# Patient Record
Sex: Female | Born: 1960 | Race: White | Hispanic: No | Marital: Married | Smoking: Never smoker
Health system: Southern US, Community
[De-identification: ages and names within clinical notes are randomized; demographics above are authoritative.]

## PROBLEM LIST (undated history)

## (undated) DIAGNOSIS — M549 Dorsalgia, unspecified: Secondary | ICD-10-CM

## (undated) DIAGNOSIS — I341 Nonrheumatic mitral (valve) prolapse: Secondary | ICD-10-CM

## (undated) DIAGNOSIS — E785 Hyperlipidemia, unspecified: Secondary | ICD-10-CM

## (undated) DIAGNOSIS — Z9889 Other specified postprocedural states: Secondary | ICD-10-CM

## (undated) DIAGNOSIS — F32A Depression, unspecified: Secondary | ICD-10-CM

## (undated) DIAGNOSIS — F419 Anxiety disorder, unspecified: Secondary | ICD-10-CM

## (undated) DIAGNOSIS — E559 Vitamin D deficiency, unspecified: Secondary | ICD-10-CM

## (undated) DIAGNOSIS — S52509A Unspecified fracture of the lower end of unspecified radius, initial encounter for closed fracture: Secondary | ICD-10-CM

## (undated) DIAGNOSIS — R112 Nausea with vomiting, unspecified: Secondary | ICD-10-CM

## (undated) DIAGNOSIS — K760 Fatty (change of) liver, not elsewhere classified: Secondary | ICD-10-CM

## (undated) DIAGNOSIS — E538 Deficiency of other specified B group vitamins: Secondary | ICD-10-CM

## (undated) DIAGNOSIS — S42201A Unspecified fracture of upper end of right humerus, initial encounter for closed fracture: Secondary | ICD-10-CM

## (undated) DIAGNOSIS — E28319 Asymptomatic premature menopause: Secondary | ICD-10-CM

## (undated) DIAGNOSIS — F329 Major depressive disorder, single episode, unspecified: Secondary | ICD-10-CM

## (undated) DIAGNOSIS — M8580 Other specified disorders of bone density and structure, unspecified site: Secondary | ICD-10-CM

## (undated) HISTORY — DX: Hyperlipidemia, unspecified: E78.5

## (undated) HISTORY — DX: Dorsalgia, unspecified: M54.9

## (undated) HISTORY — DX: Unspecified fracture of the lower end of unspecified radius, initial encounter for closed fracture: S52.509A

## (undated) HISTORY — PX: OTHER SURGICAL HISTORY: SHX169

## (undated) HISTORY — DX: Vitamin D deficiency, unspecified: E55.9

## (undated) HISTORY — DX: Other specified disorders of bone density and structure, unspecified site: M85.80

## (undated) HISTORY — DX: Deficiency of other specified B group vitamins: E53.8

## (undated) HISTORY — DX: Fatty (change of) liver, not elsewhere classified: K76.0

## (undated) HISTORY — DX: Nonrheumatic mitral (valve) prolapse: I34.1

## (undated) HISTORY — DX: Asymptomatic premature menopause: E28.319

---
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
## 2001-05-21 ENCOUNTER — Other Ambulatory Visit: Admission: RE | Admit: 2001-05-21 | Discharge: 2001-05-21 | Payer: Self-pay | Admitting: Gynecology

## 2003-12-02 ENCOUNTER — Other Ambulatory Visit: Admission: RE | Admit: 2003-12-02 | Discharge: 2003-12-02 | Payer: Self-pay | Admitting: Family Medicine

## 2005-06-03 ENCOUNTER — Other Ambulatory Visit: Admission: RE | Admit: 2005-06-03 | Discharge: 2005-06-03 | Payer: Self-pay | Admitting: Obstetrics & Gynecology

## 2008-08-14 ENCOUNTER — Encounter: Payer: Self-pay | Admitting: Family Medicine

## 2008-08-14 ENCOUNTER — Other Ambulatory Visit: Admission: RE | Admit: 2008-08-14 | Discharge: 2008-08-14 | Payer: Self-pay | Admitting: Family Medicine

## 2008-08-14 LAB — CONVERTED CEMR LAB: Pap Smear: NORMAL

## 2009-02-18 ENCOUNTER — Encounter: Payer: Self-pay | Admitting: Family Medicine

## 2009-03-02 ENCOUNTER — Encounter: Payer: Self-pay | Admitting: Family Medicine

## 2009-03-04 ENCOUNTER — Encounter: Payer: Self-pay | Admitting: Family Medicine

## 2009-11-25 ENCOUNTER — Encounter: Admission: RE | Admit: 2009-11-25 | Discharge: 2009-11-25 | Payer: Self-pay | Admitting: Family Medicine

## 2009-12-08 LAB — HM MAMMOGRAPHY: HM Mammogram: NORMAL

## 2009-12-16 ENCOUNTER — Ambulatory Visit: Payer: Self-pay | Admitting: Family Medicine

## 2009-12-16 DIAGNOSIS — F329 Major depressive disorder, single episode, unspecified: Secondary | ICD-10-CM

## 2009-12-16 DIAGNOSIS — R5383 Other fatigue: Secondary | ICD-10-CM

## 2009-12-16 DIAGNOSIS — E559 Vitamin D deficiency, unspecified: Secondary | ICD-10-CM | POA: Insufficient documentation

## 2009-12-16 DIAGNOSIS — E669 Obesity, unspecified: Secondary | ICD-10-CM | POA: Insufficient documentation

## 2009-12-16 DIAGNOSIS — F3289 Other specified depressive episodes: Secondary | ICD-10-CM | POA: Insufficient documentation

## 2009-12-16 DIAGNOSIS — R5381 Other malaise: Secondary | ICD-10-CM | POA: Insufficient documentation

## 2009-12-17 LAB — CONVERTED CEMR LAB
ALT: 17 units/L (ref 0–35)
Creatinine, Ser: 0.69 mg/dL (ref 0.40–1.20)
Sodium: 142 meq/L (ref 135–145)
TSH: 2.693 microintl units/mL (ref 0.350–4.500)
Total Bilirubin: 0.4 mg/dL (ref 0.3–1.2)
Total CHOL/HDL Ratio: 4.5
Total Protein: 7.1 g/dL (ref 6.0–8.3)
Triglycerides: 67 mg/dL (ref <150)
VLDL: 13 mg/dL (ref 0–40)
Vitamin B-12: 1282 pg/mL — ABNORMAL HIGH (ref 211–911)

## 2009-12-31 ENCOUNTER — Encounter: Payer: Self-pay | Admitting: Family Medicine

## 2009-12-31 DIAGNOSIS — K449 Diaphragmatic hernia without obstruction or gangrene: Secondary | ICD-10-CM | POA: Insufficient documentation

## 2009-12-31 DIAGNOSIS — G5620 Lesion of ulnar nerve, unspecified upper limb: Secondary | ICD-10-CM | POA: Insufficient documentation

## 2010-02-04 ENCOUNTER — Ambulatory Visit: Payer: Self-pay | Admitting: Family Medicine

## 2010-02-04 DIAGNOSIS — N390 Urinary tract infection, site not specified: Secondary | ICD-10-CM | POA: Insufficient documentation

## 2010-02-04 LAB — CONVERTED CEMR LAB
Bilirubin Urine: NEGATIVE
Ketones, urine, test strip: NEGATIVE
Nitrite: NEGATIVE
Specific Gravity, Urine: 1.015
pH: 8

## 2010-02-22 ENCOUNTER — Ambulatory Visit: Payer: Self-pay | Admitting: Family Medicine

## 2010-02-22 DIAGNOSIS — E785 Hyperlipidemia, unspecified: Secondary | ICD-10-CM | POA: Insufficient documentation

## 2010-03-12 ENCOUNTER — Encounter: Payer: Self-pay | Admitting: Family Medicine

## 2010-03-15 LAB — CONVERTED CEMR LAB
AST: 18 units/L (ref 0–37)
Direct LDL: 87 mg/dL

## 2010-03-16 ENCOUNTER — Telehealth: Payer: Self-pay | Admitting: Family Medicine

## 2010-03-28 DIAGNOSIS — I341 Nonrheumatic mitral (valve) prolapse: Secondary | ICD-10-CM

## 2010-03-28 HISTORY — DX: Nonrheumatic mitral (valve) prolapse: I34.1

## 2010-04-02 ENCOUNTER — Ambulatory Visit
Admission: RE | Admit: 2010-04-02 | Discharge: 2010-04-02 | Payer: Self-pay | Source: Home / Self Care | Attending: Family Medicine | Admitting: Family Medicine

## 2010-04-23 ENCOUNTER — Ambulatory Visit
Admission: RE | Admit: 2010-04-23 | Discharge: 2010-04-23 | Payer: Self-pay | Source: Home / Self Care | Attending: Family Medicine | Admitting: Family Medicine

## 2010-04-23 ENCOUNTER — Encounter: Payer: Self-pay | Admitting: Family Medicine

## 2010-04-23 DIAGNOSIS — E538 Deficiency of other specified B group vitamins: Secondary | ICD-10-CM | POA: Insufficient documentation

## 2010-04-29 NOTE — Assessment & Plan Note (Signed)
Summary: f/u wt   Vital Signs:  Patient profile:   50 year old female Height:      59.75 inches Weight:      174 pounds BMI:     34.39 O2 Sat:      100 % on Room air Pulse rate:   83 / minute BP sitting:   102 / 57  (left arm) Cuff size:   large  Vitals Entered By: Payton Spark CMA (April 23, 2010 8:38 AM)  O2 Flow:  Room air CC: F/U weight.    Primary Care Provider:  Seymour Bars DO  CC:  F/U weight. .  History of Present Illness: 50 yo WF presents for f/u weight management.  She has plateued with her weight loss.  She was taken off Phentermine 3 wks ago b/c she failed to lose weight for 1 month.  She has stopped exercising because her daughter has moved back in and the treadmill is in her room.  She has not been exercising for the past 2 mos and has been snacking more.    She is going on a cruise in 3 wks and is worried about overeating and motion sickness.     Allergies: No Known Drug Allergies  Past History:  Past Medical History: Reviewed history from 02/22/2010 and no changes required. high cholesterol early menopause  G1P1 MVP-- schedule ECHO in 2012 premature menopause osteopenia B12 def. distal radius fx at 45-- NEEDS DEXA  Social History: Reviewed history from 12/16/2009 and no changes required. 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 Walks on the treadmill 30 min daily. Good diet. Married to [employer].  Has 1 grown child.  Review of Systems      See HPI  Physical Exam  General:  alert, well-developed, well-nourished, and well-hydrated.  obese Head:  normocephalic and atraumatic.   Mouth:  pharynx pink and moist.   Neck:  no masses.   Lungs:  Normal respiratory effort, chest expands symmetrically. Lungs are clear to auscultation, no crackles or wheezes. Heart:  Normal rate and regular rhythm. S1 and S2 normal without gallop, murmur, click, rub or other extra sounds. Skin:  color normal.   Psych:  good eye contact,  not anxious appearing, and not depressed appearing.     Impression & Recommendations:  Problem # 1:  B12 DEFICIENCY (ICD-266.2) REcheck level.  Off shot since Sept. Orders: T-Vitamin B12 ([id number])  Problem # 2:  OBESITY, UNSPECIFIED (ICD-278.00) Unchanged.  Off Phentermine x 3 wks and wt has plateued.  Pt has stopped exercising and 'lost motivation' but wants to get down to 130.  Her weight is complicated by hyperlipidemia and depression. She agrees to start dietary log, restart exercise (will move the treadmill) and will retry Phentermine 5 days/ wk. RTC in 6 wks for f/u wt/ mood.  Complete Medication List: 1)  Womens One Daily Tabs (Multiple vitamins-minerals) .... Take 1 tab by mouth once daily 2)  Calcium 500 Mg Tabs (Calcium) .... Take 1 tab by mouth once daily 3)  Aspirin 81 Mg Tbec (Aspirin) .... Take 1 tab by mouth once daily 4)  Vitamin B-6 250 Mg Tabs (Pyridoxine hcl) .... Take 1 tab by mouth once daily 5)  Wellbutrin Xl 300 Mg Xr24h-tab (Bupropion hcl) .Marland Kitchen.. 1 tab by mouth daily 6)  Phentermine Hcl 37.5 Mg Caps (Phentermine hcl) .Marland Kitchen.. 1 capsule by mouth qam 7)  Crestor 10 Mg Tabs (Rosuvastatin calcium) .Marland Kitchen.. 1 tab by mouth qhs 8)  Transderm-scop 1.5 Mg  Pt72 (Scopolamine base) .... Apply 1 patch q 3 days as needed motion sickness;  Patient Instructions: 1)  Start food and exercise diary. 2)  Increase fluid intake during the day. 3)  Try flavored water or crystal light. 4)  Aim for 45+ min of exercise 4-5 days/ wk. 5)  Restart Phenermine.   6)  B12 level today. 7)  Will call you w/ result on Monday. 8)  REturn for f/u mood/ wt in 6 wks. Prescriptions: TRANSDERM-SCOP 1.5 MG PT72 (SCOPOLAMINE BASE) apply 1 patch q 3 days as needed motion sickness;  #1 box x 0   Entered and Authorized by:   Seymour Bars DO   Signed by:   Seymour Bars DO on 04/23/2010   Method used:   Electronically to        [facility]* (retail)       [address] [place], [place]  [address]       Ph: [phone]       Fax: [phone]   RxID:   [id number] PHENTERMINE HCL 37.5 MG CAPS (PHENTERMINE HCL) 1 capsule by mouth qAM  #30 x 0   Entered and Authorized by:   Seymour Bars DO   Signed by:   Seymour Bars DO on 04/23/2010   Method used:   Printed then faxed to ...       [facility]* (retail)       [address] [place], [place]  [address]       Ph: [phone]       Fax: [phone]   RxID:   [id number]    Orders Added: 1)  T-Vitamin B12 [[id number]] 2)  Est. Patient Level III [63016]

## 2010-04-29 NOTE — Assessment & Plan Note (Signed)
Summary: nurse appt - jr  Nurse Visit   Vital Signs:  Patient profile:   50 year old female Height:      59.75 inches Weight:      175 pounds BMI:     34.59 O2 Sat:      100 % on Room air Pulse rate:   90 / minute BP sitting:   108 / 70  (left arm) Cuff size:   large  Vitals Entered By: Payton Spark CMA (April 02, 2010 8:18 AM)  O2 Flow:  Room air  Impression & Recommendations:  Problem # 1:  OBESITY, UNSPECIFIED (ICD-278.00) No wt lost on Phentermine since 11-28.  BP / HR looks great. Will not RF her phentermine.  She is to work on [employer],  ~1500 kcal/ day - high protein/ low carb with 1 hr of exercise 5-6 days/ wk and she should have f/u with me in 3 wks.  Complete Medication List: 1)  Womens One Daily Tabs (Multiple vitamins-minerals) .... Take 1 tab by mouth once daily 2)  Calcium 500 Mg Tabs (Calcium) .... Take 1 tab by mouth once daily 3)  Aspirin 81 Mg Tbec (Aspirin) .... Take 1 tab by mouth once daily 4)  Vitamin B-6 250 Mg Tabs (Pyridoxine hcl) .... Take 1 tab by mouth once daily 5)  Wellbutrin Xl 300 Mg Xr24h-tab (Bupropion hcl) .Marland Kitchen.. 1 tab by mouth daily 6)  Phentermine Hcl 37.5 Mg Caps (Phentermine hcl) .Marland Kitchen.. 1 capsule by mouth qam 7)  Crestor 10 Mg Tabs (Rosuvastatin calcium) .Marland Kitchen.. 1 tab by mouth qhs  CC: Weight and BP check   Allergies: No Known Drug Allergies  Orders Added: 1)  Est. Patient Level I [[address]109]

## 2010-04-29 NOTE — Progress Notes (Signed)
Summary: Phentermine refill  Phone Note Call from Patient   Caller: Patient Summary of Call: Pt states she has a OV scheduled for F/U weight but you told her at last OV that you would refill phentermine before OV. Pt was getting from [facility] in [place] is now out. Please advise. Initial call taken by: Payton Spark CMA,  March 16, 2010 9:57 AM    Prescriptions: PHENTERMINE HCL 37.5 MG CAPS (PHENTERMINE HCL) 1 capsule by mouth qAM  #30 x 0   Entered and Authorized by:   Seymour Bars DO   Signed by:   Seymour Bars DO on 03/16/2010   Method used:   Printed then faxed to ...       [facility]  [address] [phone][facility] (retail)       [address] [place]  [address]       Ph: [phone] or [phone]       Fax: [phone]   RxID:   [id number]   Appended Document: Phentermine refill faxed to [facility] in [facility]

## 2010-04-29 NOTE — Letter (Signed)
Summary: [facility]  [facility]   Imported By: Lanelle Bal 01/13/2010 11:40:10  _____________________________________________________________________  External Attachment:    Type:   Image     Comment:   External Document

## 2010-04-29 NOTE — Assessment & Plan Note (Signed)
Summary: UTI  Nurse Visit   Vital Signs:  Patient profile:   50 year old female Temp:     98.0 degrees F  Vitals Entered By: Payton Spark CMA (February 04, 2010 10:41 AM)  Primary Care Provider:  Seymour Bars DO   History of Present Illness: C/o hemturia a week. Dysurui, frequency.     Impression & Recommendations:  Problem # 1:  UTI (ICD-599.0)  Her updated medication list for this problem includes:    Bactrim Ds 800-160 Mg Tabs (Sulfamethoxazole-trimethoprim) .Marland Kitchen... Take 1 tablet by mouth two times a day for 3 days  Encouraged to push clear liquids, get enough rest, and take acetaminophen as needed. To be seen in 10 days if no improvement, sooner if worse.  Orders: UA Dipstick w/o Micro (automated)  (81003)  Complete Medication List: 1)  Womens One Daily Tabs (Multiple vitamins-minerals) .... Take 1 tab by mouth once daily 2)  Calcium 500 Mg Tabs (Calcium) .... Take 1 tab by mouth once daily 3)  Aspirin 81 Mg Tbec (Aspirin) .... Take 1 tab by mouth once daily 4)  Vitamin B-6 250 Mg Tabs (Pyridoxine hcl) .... Take 1 tab by mouth once daily 5)  Wellbutrin Xl 150 Mg Xr24h-tab (Bupropion hcl) .Marland Kitchen.. 1 tab by mouth qam 6)  Phentermine Hcl 37.5 Mg Caps (Phentermine hcl) .Marland Kitchen.. 1 capsule by mouth qam 7)  Crestor 10 Mg Tabs (Rosuvastatin calcium) .Marland Kitchen.. 1 tab by mouth qhs 8)  Bactrim Ds 800-160 Mg Tabs (Sulfamethoxazole-trimethoprim) .... Take 1 tablet by mouth two times a day for 3 days     Patient Instructions: 1)  Complete antiobiotic and if not better then needs Office visit.    Allergies: No Known Drug Allergies Laboratory Results   Urine Tests    Routine Urinalysis   Color: yellow Appearance: Clear Glucose: negative   (Normal Range: Negative) Bilirubin: negative   (Normal Range: Negative) Ketone: negative   (Normal Range: Negative) Spec. Gravity: 1.015   (Normal Range: 1.003-1.035) Blood: trace-intact   (Normal Range: Negative) pH: 8.0   (Normal Range:  5.0-8.0) Protein: negative   (Normal Range: Negative) Urobilinogen: 0.2   (Normal Range: 0-1) Nitrite: negative   (Normal Range: Negative) Leukocyte Esterace: small   (Normal Range: Negative)       Orders Added: 1)  UA Dipstick w/o Micro (automated)  [81003] 2)  Est. Patient Level I [60454] Prescriptions: BACTRIM DS 800-160 MG TABS (SULFAMETHOXAZOLE-TRIMETHOPRIM) Take 1 tablet by mouth two times a day for 3 days  #6 x 0   Entered and Authorized by:   Nani Gasser MD   Signed by:   Nani Gasser MD on 02/04/2010   Method used:   [facility]  [address] [phone][facility] (retail[facility]       [address] [place], [place]  [address]       Ph: [phone] or [phone]       Fax: [phone]   RxID:   [id number]

## 2010-04-29 NOTE — Letter (Signed)
Summary: [facility]  [facility]   Imported By: Lanelle Bal 01/13/2010 11:40:57  _____________________________________________________________________  External Attachment:    Type:   Image     Comment:   External Document

## 2010-04-29 NOTE — Letter (Signed)
Summary: Depression Questionnaire  Depression Questionnaire   Imported By: Lanelle Bal 12/23/2009 13:11:09  _____________________________________________________________________  External Attachment:    Type:   Image     Comment:   External Document

## 2010-04-29 NOTE — Letter (Signed)
Summary: [facility]  [facility]   Imported By: Lanelle Bal 01/13/2010 11:44:35  _____________________________________________________________________  External Attachment:    Type:   Image     Comment:   External Document

## 2010-04-29 NOTE — Letter (Signed)
Summary: Patient Health Questionnaire  Patient Health Questionnaire   Imported By: Maryln Gottron 03/05/2010 13:40:40  _____________________________________________________________________  External Attachment:    Type:   Image     Comment:   External Document

## 2010-04-29 NOTE — Miscellaneous (Signed)
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) Observations: Added new observation of PAP DUE: 08/15/2010 (12/31/2009 16:20) Added new observation of TDBOOSTDUE: 12/26/2012 (12/31/2009 16:20) Added new observation of MAMMO DUE: 12/09/2010 (12/31/2009 16:20) Added new observation of HDLNXTDUE: 12/17/2014 (12/31/2009 16:20) Added new observation of LDLNXTDUE: 12/17/2014 (12/31/2009 16:20) Added new observation of CREATNXTDUE: 12/17/2010 (12/31/2009 16:20) Added new observation of POTASSIUMDUE: 12/17/2010 (12/31/2009 16:20) Added new observation of FAMILY HX: father HTN, high cholesterol, AMI mother high chol, HTN, CVA, dementia  (12/31/2009 16:20) Added new observation of PAST MED HX: high cholesterol early menopause  G1P1 MVP premature menopause osteopenia B12 def. distal radius fx at 45-- NEEDS DEXA (12/31/2009 16:20) Added new observation of PRIMARY MD: Seymour Bars DO (12/31/2009 16:20) Added new observation of LAST MAM DAT: 12/08/2009 (12/08/2009 16:23) Added new observation of MAMMOGRAM: normal (12/08/2009 16:23) Added new observation of LAST PAP DAT: 08/14/2008 (08/14/2008 16:26) Added new observation of PAP SMEAR: normal (08/14/2008 16:26) Added new observation of TD BOOSTER: given (12/27/2002 16:25)       Past History:  Past Medical History: high cholesterol early menopause  G1P1 MVP premature menopause osteopenia B12 def. distal radius fx at 45-- NEEDS DEXA   Family History: father HTN, high cholesterol, AMI mother high chol, HTN, CVA, dementia   TD Result Date:  12/27/2002 TD Result:  given TD Next Due:  10 yr PAP Result Date:  08/14/2008 PAP Result:  normal PAP Next Due:  2 yr Mammogram Result Date:  12/08/2009 Mammogram Result:  normal Mammogram Next Due:  1 yr

## 2010-04-29 NOTE — Assessment & Plan Note (Signed)
Summary: Bethany Griffin/ wt/ cholesterol   Vital Signs:  Patient profile:   50 year old female Height:      59.75 inches Weight:      175 pounds BMI:     34.59 O2 Sat:      100 % on Room air Pulse rate:   95 / minute BP sitting:   106 / 67  (left arm) Cuff size:   large  Vitals Entered By: Payton Spark CMA (February 22, 2010 9:46 AM)  O2 Flow:  Room air CC: Bethany Griffin.   Primary Care Provider:  Seymour Bars DO  CC:  Bethany Griffin.Marland Kitchen  History of Present Illness: 50 yo WF presents for Bethany depression and obesity.  She is down another 6 lbs - doing to the [facility]ariat[facility] Clinic in [place], on Phentermine. Her B12 was too high.  TSH was normal. LDL was high 175, started Crestor 6 wks ago.  Doing well.  on Wellbutrin x 2 mos but hasn't seen much improvmenet.  Denies suicidal ideations.  Has a good support system.   Current Medications (verified): 1)  Womens One Daily  Tabs (Multiple Vitamins-Minerals) .... Take 1 Tab By Mouth Once Daily 2)  Calcium 500 Mg Tabs (Calcium) .... Take 1 Tab By Mouth Once Daily 3)  Aspirin 81 Mg Tbec (Aspirin) .... Take 1 Tab By Mouth Once Daily 4)  Vitamin B-6 250 Mg Tabs (Pyridoxine Hcl) .... Take 1 Tab By Mouth Once Daily 5)  Wellbutrin Xl 150 Mg Xr24h-Tab (Bupropion Hcl) .Marland Kitchen.. 1 Tab By Mouth Qam 6)  Phentermine Hcl 37.5 Mg Caps (Phentermine Hcl) .Marland Kitchen.. 1 Capsule By Mouth Qam 7)  Crestor 10 Mg Tabs (Rosuvastatin Calcium) .Marland Kitchen.. 1 Tab By Mouth Qhs  Allergies (verified): No Known Drug Allergies  Past History:  Past Medical History: high cholesterol early menopause  G1P1 MVP-- schedule ECHO in 2012 premature menopause osteopenia B12 def. distal radius fx at 45-- NEEDS DEXA  Past Surgical History: Reviewed history from 12/16/2009 and no changes required. eye surgery 1974, 1998, ulnar nerve surgery 2010  Social History: Reviewed history from 12/16/2009 and no changes required. 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 Walks on the  treadmill 30 min daily. Good diet. Married to [employer].  Has 1 grown child.  Review of Systems Psych:  Complains of depression and easily tearful; denies anxiety, irritability, panic attacks, and suicidal thoughts/plans.  Physical Exam  General:  alert, well-developed, well-nourished, and well-hydrated.  obese Head:  normocephalic and atraumatic.   Neck:  no masses.   Lungs:  Normal respiratory effort, chest expands symmetrically. Lungs are clear to auscultation, no crackles or wheezes. Heart:  Normal rate and regular rhythm. S1 and S2 normal without gallop, murmur, click, rub or other extra sounds. Psych:  good eye contact and flat affect.     Impression & Recommendations:  Problem # 1:  DEPRESSION, MILD (ICD-311) PHQ 9 worsened from a 10--> 11 with addition of Wellbutrin.  Will increase her to 300 mg once daily and call if any problems.  RTC in 2 mos to recheck.  Add SSRI or SNRI if not improving.  I do think that much of her depression is centered around her weight and that increasing her exercise will help with both Griffin and wt loss.   Her updated medication list for this problem includes:    Wellbutrin Xl 300 Mg Xr24h-tab (Bupropion hcl) .Marland Kitchen... 1 tab by mouth daily  Problem # 2:  OBESITY, UNSPECIFIED (ICD-278.00) BMI 34 c/w  class II obesity, down a total of 82 lbs in 1.5 yrs. She has been going to the Bariatric clinic, on phentermine for a while.  She feels like it's not working as well and she is emotionally eating and only exercising 30 min 3-4 x a wk.  We discussed diet  to be low carb, low sugar and increasing her exercise to 1 hr 5 days/ wk. I can RF her pHentermine here and see her back in 4 wks for a nurse visit to follow.  Problem # 3:  HYPERLIPIDEMIA (ICD-272.4)  Her updated medication list for this problem includes:    Crestor 10 Mg Tabs (Rosuvastatin calcium) .Marland Kitchen... 1 tab by mouth qhs Started Crestor 6 wks ago.  Doing well on it.  Lab order given to have drawn in 2-3  wks.   Orders: T-LDL Direct [phone]) T-ALT/SGPT [id number]) T-AST/SGOT [phone])  Labs Reviewed: SGOT: 17 (12/16/2009)   SGPT: 17 (12/16/2009)   HDL:53 (12/16/2009)  LDL:175 (12/16/2009)  Chol:241 (12/16/2009)  Trig:67 (12/16/2009)  Complete Medication List: 1)  Womens One Daily Tabs (Multiple vitamins-minerals) .... Take 1 tab by mouth once daily 2)  Calcium 500 Mg Tabs (Calcium) .... Take 1 tab by mouth once daily 3)  Aspirin 81 Mg Tbec (Aspirin) .... Take 1 tab by mouth once daily 4)  Vitamin B-6 250 Mg Tabs (Pyridoxine hcl) .... Take 1 tab by mouth once daily 5)  Wellbutrin Xl 300 Mg Xr24h-tab (Bupropion hcl) .Marland Kitchen.. 1 tab by mouth daily 6)  Phentermine Hcl 37.5 Mg Caps (Phentermine hcl) .Marland Kitchen.. 1 capsule by mouth qam 7)  Crestor 10 Mg Tabs (Rosuvastatin calcium) .Marland Kitchen.. 1 tab by mouth qhs  Patient Instructions: 1)  Increase Wellbutrin dose to 300 mg once daily. 2)  Update lab downstairs in 3 wks. 3)  Will call you w/ results. 4)    5)  Increase exercise to 60 min 5-6 days/ wk with intervals. 6)  Stick to a low carb/ low sugar healthy diet. 7)  REturn for follow up appt in 2 mos. Prescriptions: WELLBUTRIN XL 300 MG XR24H-TAB (BUPROPION HCL) 1 tab by mouth daily  #30 x 3   Entered and Authorized by:   Seymour Bars DO   Signed by:   Seymour Bars DO on 02/22/2010   Method used:   [facility]        [facility]  [address] [id number]       [address] [place], [place]  [address]       Ph: [phone] or [phone]       Fax: [phone]   RxID:   [id number]    Orders Added: 1)  T-LDL Direct [phone] 2)  T-ALT/SGPT [[id number]] 3)  T-AST/SGOT [[id number]] 4)  Est. Patient Level IV [99371]

## 2010-04-29 NOTE — Assessment & Plan Note (Signed)
Summary: NOV mood/ WT   Vital Signs:  Patient profile:   50 year old female Height:      59.75 inches Weight:      181 pounds BMI:     35.77 O2 Sat:      100 % on Room air Pulse rate:   87 / minute BP sitting:   117 / 76  (left arm) Cuff size:   large  Vitals Entered By: Payton Spark CMA (December 16, 2009 8:40 AM)  O2 Flow:  Room air CC: New to est. Fasting labs.   Primary Care Provider:  Seymour Bars DO  CC:  New to est. Fasting labs..  History of Present Illness: 50 yo WF presents for NOV.    She is due for fasting labs.  hx of high cholesterol, not on meds.  Has been going to the Bariatric Clinic in [place] this year and is down 77 lbs with diet, exercise and appetite suppressants.  She c/o feeling 'down'.  Has hx of depression and has not taken anything in 5-6 yrs.  Has also done counseling in the past.      Current Medications (verified): 1)  Womens One Daily  Tabs (Multiple Vitamins-Minerals) .... Take 1 Tab By Mouth Once Daily 2)  Calcium 500 Mg Tabs (Calcium) .... Take 1 Tab By Mouth Once Daily 3)  Aspirin 81 Mg Tbec (Aspirin) .... Take 1 Tab By Mouth Once Daily 4)  Vitamin B-6 250 Mg Tabs (Pyridoxine Hcl) .... 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 Walks on the treadmill 30 min daily. Good diet. Married to [employer].  Has 1 grown child.  Review of Systems       no fevers/sweats/weakness, unexplained wt loss/gain, no change in vision, no difficulty hearing, ringing in ears, no hay fever/allergies, no CP/discomfort, no palpitations, no breast lump/nipple discharge, no cough/wheeze, no blood in  stool, no N/V/D, no nocturia, no leaking urine, no unusual vag bleeding, no vaginal/penile discharge, no muscle/joint pain, no rash, no new/changing mole, no HA, no memory loss, no anxiety, no sleep problem, no depression, no unexplained lumps, no easy bruising/bleeding, no concern with sexual function   Physical Exam  General:  alert, well-developed, well-nourished, and well-hydrated.  obese Head:  normocephalic and atraumatic.   Eyes:  pupils equal, pupils round, and pupils reactive to light.   Mouth:  pharynx pink and moist.   Neck:  no masses.   Lungs:  Normal respiratory effort, chest expands symmetrically. Lungs are clear to auscultation, no crackles or wheezes. Heart:  Normal rate and regular rhythm. S1 and S2 normal without gallop, murmur, click, rub or other extra sounds. Extremities:  no E/C/C Skin:  color normal.   Cervical Nodes:  No lymphadenopathy noted Psych:  good eye contact, not anxious appearing, and flat affect.     Impression & Recommendations:  Problem # 1:  DEPRESSION, MILD (ICD-311) PHQ -9 score : 10 c/w mild depression. Discussed options with pt.  Because she has fatigue, emotional eating  problems, I think she would benefit from treatment with wellbutrin which she did well on in the past.  Call if any problems.  F/U in 2 mos.  Her updated medication list for this problem includes:    Wellbutrin Xl 150 Mg Xr24h-tab (Bupropion hcl) .Marland Kitchen... 1 tab by mouth qam  Problem # 2:  OBESITY, UNSPECIFIED (ICD-278.00) BMI 35.  Down 77 lbs.  Has been going to the [facility]. Offered her to do wt managment thru me. Continue healthy diet.  Address depression as of #1. Continue 1 hr exercise/ day. Check Vit D and B12 level with TSH today along with fasting labs.  Complete Medication List: 1)  Womens One Daily Tabs (Multiple vitamins-minerals) .... Take 1 tab by mouth once daily 2)  Calcium 500 Mg Tabs (Calcium) .... Take 1 tab by mouth once daily 3)  Aspirin 81 Mg Tbec  (Aspirin) .... Take 1 tab by mouth once daily 4)  Vitamin B-6 250 Mg Tabs (Pyridoxine hcl) .... Take 1 tab by mouth once daily 5)  Wellbutrin Xl 150 Mg Xr24h-tab (Bupropion hcl) .Marland Kitchen.. 1 tab by mouth qam 6)  Phentermine Hcl 37.5 Mg Caps (Phentermine hcl) .Marland Kitchen.. 1 capsule by mouth qam  Other Orders: T-Comprehensive Metabolic Panel [id number]) T-Lipid Profile ([id number]) T-TSH [id number]) T-Vitamin B12 [phone]) T-Vitamin D (25-Hydroxy) ([id number])  Patient Instructions: 1)  Add Wellbutrin XL 150 mg every AM for mood. 2)  You should start to see improvements after 3 wks. 3)  Keep up the excellent work with diet/ exercise/ wt loss. 4)  Check labs downstairs today. 5)  Will call you w/ results tomorrow. 6)  RTC for f/u mood/ WT in 2 mos. Prescriptions: WELLBUTRIN XL 150 MG XR24H-TAB (BUPROPION HCL) 1 tab by mouth qAM  #30 x 2   Entered and Authorized by:   Seymour Bars DO   Signed by:   Seymour Bars DO on 12/16/2009   Method used:   [facility]        [facility]  [address] [phone][facility] ([facility]ail)       [address] [place], [place]  [address]       Ph: [phone] or [phone]       Fax: [phone]   RxID:   [id number]

## 2010-06-02 ENCOUNTER — Encounter: Payer: Self-pay | Admitting: Family Medicine

## 2010-06-02 ENCOUNTER — Ambulatory Visit (INDEPENDENT_AMBULATORY_CARE_PROVIDER_SITE_OTHER): Payer: PRIVATE HEALTH INSURANCE | Admitting: Family Medicine

## 2010-06-02 DIAGNOSIS — IMO0001 Reserved for inherently not codable concepts without codable children: Secondary | ICD-10-CM

## 2010-06-02 DIAGNOSIS — F3289 Other specified depressive episodes: Secondary | ICD-10-CM

## 2010-06-02 DIAGNOSIS — E785 Hyperlipidemia, unspecified: Secondary | ICD-10-CM

## 2010-06-02 DIAGNOSIS — F329 Major depressive disorder, single episode, unspecified: Secondary | ICD-10-CM

## 2010-06-02 DIAGNOSIS — E669 Obesity, unspecified: Secondary | ICD-10-CM

## 2010-06-03 ENCOUNTER — Encounter: Payer: Self-pay | Admitting: Family Medicine

## 2010-06-03 LAB — CONVERTED CEMR LAB: Total CK: 53 units/L (ref 7–177)

## 2010-06-04 ENCOUNTER — Ambulatory Visit: Payer: Self-pay | Admitting: Family Medicine

## 2010-06-08 NOTE — Assessment & Plan Note (Signed)
Summary: f/u mood/ wt/ myalgias on Crestor   Vital Signs:  Patient profile:   50 year old female Height:      59.75 inches Weight:      179 pounds BMI:     35.38 O2 Sat:      100 % on Room air Pulse rate:   77 / minute BP sitting:   136 / 82  (left arm) Cuff size:   large  Vitals Entered By: Payton Spark CMA (June 02, 2010 10:52 AM)  O2 Flow:  Room air CC: F/U weight and mood   Primary Care Provider:  Seymour Bars DO  CC:  F/U weight and mood.  History of Present Illness: 50 yo WF presents for f/u depression, weight and joint pains since starting Crestor.  She just started having pains in all of her joints with new start of Crestor.  She stopped it 2 nights ago.  Denies recent illness, swelling or redness.  Not taking anything other than her husband's 'pain pills'.   She has gained 5 lbs even w/ phentermine and admits to not complying with her diet and not exercising.  Says she is not stressed about about the fact that her husband is having surgery for a spot in his colon that may be cancerous.   Mood is stable and she is happy with results of Wellbutrin.     Current Medications (verified): 1)  Womens One Daily  Tabs (Multiple Vitamins-Minerals) .... Take 1 Tab By Mouth Once Daily 2)  Calcium 500 Mg Tabs (Calcium) .... Take 1 Tab By Mouth Once Daily 3)  Aspirin 81 Mg Tbec (Aspirin) .... Take 1 Tab By Mouth Once Daily 4)  Vitamin B-6 250 Mg Tabs (Pyridoxine Hcl) .... Take 1 Tab By Mouth Once Daily 5)  Wellbutrin Xl 300 Mg Xr24h-Tab (Bupropion Hcl) .Marland Kitchen.. 1 Tab By Mouth Daily 6)  Crestor 10 Mg Tabs (Rosuvastatin Calcium) .Marland Kitchen.. 1 Tab By Mouth Qhs  Allergies (verified): No Known Drug Allergies  Past History:  Past Medical History: high cholesterol early menopause  G1P1 MVP-- schedule ECHO in 2012 premature menopause osteopenia B12 def.-- normal 04-2010 distal radius fx at 45-- NEEDS DEXA  Past Surgical History: Reviewed history from 12/16/2009 and no changes  required. eye surgery 1974, 1998, ulnar nerve surgery 2010  Social History: Reviewed history from 12/16/2009 and no changes required. 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 Walks on the treadmill 30 min daily. Good diet. Married to [employer].  Has 1 grown child.  Review of Systems      See HPI  Physical Exam  General:  alert, well-developed, well-nourished, and well-hydrated.   Eyes:  sclera non icteric, wears glasses Neck:  no masses and no thyromegaly.   Lungs:  Normal respiratory effort, chest expands symmetrically. Lungs are clear to auscultation, no crackles or wheezes. Heart:  Normal rate and regular rhythm. S1 and S2 normal without gallop, murmur, click, rub or other extra sounds. Msk:  no joint swelling, no joint warmth, and no redness over joints.   Extremities:  trace LE edema bilat Skin:  color normal and no rashes.   Cervical Nodes:  No lymphadenopathy noted Psych:  good eye contact, not anxious appearing, and flat affect.     Impression & Recommendations:  Problem # 1:  MYALGIA (ICD-729.1) Myalgias likely due to Crestor (new for the past 3-4 mos).  Will hold her Crestor for the next month and see if myalgias resolve.  If they do, will  try Livalo or non statin therapy.  If they do not, will restart on lower dose.  Check CK for saftey today.  Can use Ibuprofen 800 mg three times a day with food for the next 10 days for pain. Her updated medication list for this problem includes:    Aspirin 81 Mg Tbec (Aspirin) .Marland Kitchen... Take 1 tab by mouth once daily  Orders: T-CK Total ([address]109-60454)  Problem # 2:  HYPERLIPIDEMIA (ICD-272.4) Removed Crestor due to pain but had remarkable improvements in LDL on it. The following medications were removed from the medication list:    Crestor 10 Mg Tabs (Rosuvastatin calcium) .Marland Kitchen... 1 tab by mouth qhs  Labs Reviewed: SGOT: 18 (03/12/2010)   SGPT: 19 (03/12/2010)   HDL:53 (12/16/2009)  LDL:175 (12/16/2009)   Chol:241 (12/16/2009)  Trig:67 (12/16/2009)  Problem # 3:  DEPRESSION, MILD (ICD-311) PHQ 9 score: 3 indicating good control on Wellbutrin at current dose, continue. Her updated medication list for this problem includes:    Wellbutrin Xl 300 Mg Xr24h-tab (Bupropion hcl) .Marland Kitchen... 1 tab by mouth daily  Problem # 4:  OBESITY, UNSPECIFIED (ICD-278.00) BMI still 35 but she was failing to lose wt on Phentermine, in fact gained 5 lbs so explained to pt that I would not RF it.  I'd like her to work on [employer], avoiding late night snacking which is more habit and add regular exercise.  Complete Medication List: 1)  Womens One Daily Tabs (Multiple vitamins-minerals) .... Take 1 tab by mouth once daily 2)  Calcium 500 Mg Tabs (Calcium) .... Take 1 tab by mouth once daily 3)  Aspirin 81 Mg Tbec (Aspirin) .... Take 1 tab by mouth once daily 4)  Vitamin B-6 250 Mg Tabs (Pyridoxine hcl) .... Take 1 tab by mouth once daily 5)  Wellbutrin Xl 300 Mg Xr24h-tab (Bupropion hcl) .Marland Kitchen.. 1 tab by mouth daily  Patient Instructions: 1)  Stop Crestor. 2)  Check CK today.  Will call you w/ result tomorrow. 3)  Expect muscle/ joint aches to resolve in the next 2 wks. 4)  Use Ibuprofen for now as needed. 5)  Work on [employer], regular exercise, dietary logging. 6)  Return for f/u high cholesterol, weight in 2 mos. 7)  Let me know if aches / pains not resolved in 2 wks.   Orders Added: 1)  T-CK Total [82550-23250] 2)  Est. Patient Level IV [09811]

## 2010-06-15 NOTE — Letter (Signed)
Summary: Patient Health Questionnaire  Patient Health Questionnaire   Imported By: Maryln Gottron 06/08/2010 08:31:39  _____________________________________________________________________  External Attachment:    Type:   Image     Comment:   External Document

## 2010-07-13 ENCOUNTER — Other Ambulatory Visit: Payer: Self-pay | Admitting: Family Medicine

## 2010-07-21 ENCOUNTER — Telehealth: Payer: Self-pay | Admitting: Family Medicine

## 2010-07-21 ENCOUNTER — Ambulatory Visit (INDEPENDENT_AMBULATORY_CARE_PROVIDER_SITE_OTHER): Payer: PRIVATE HEALTH INSURANCE | Admitting: Family Medicine

## 2010-07-21 DIAGNOSIS — R300 Dysuria: Secondary | ICD-10-CM

## 2010-07-21 LAB — POCT URINALYSIS DIPSTICK
Blood, UA: NEGATIVE
Ketones, UA: NEGATIVE
Protein, UA: NEGATIVE
pH, UA: 8.5

## 2010-07-21 NOTE — Progress Notes (Signed)
  Subjective:    Patient ID: Bethany Griffin, female    DOB: Aug 22, 1960, 50 y.o.   MRN: [id number]  HPI Pt c/o of bladder pressure, burning, decreased urination, frequent urination, and has been taking AZO x 4 days. Some better today. Afebrile.     Review of Systems     Objective:   Physical Exam        Assessment & Plan:

## 2010-07-21 NOTE — Telephone Encounter (Signed)
Pt aware of the above  

## 2010-07-21 NOTE — Telephone Encounter (Signed)
Pt called in and complaining of bladder pressure, burning, decreased urination, frequent urination, and has been taking AZO x 4 days.  Some better today.  Afebrile.  Uses [facility]. PLAN:  Nurse appt scheduled for today 2:15pm.  Pt instructed to come with full bladder.  Last UTI 01/2010. NKDA.

## 2010-07-21 NOTE — Telephone Encounter (Signed)
Pls let pt know that her UA was normal.  She can increase water intake and take OTC AZO x 2 days.  If symptoms continue, have her set up OV

## 2010-07-21 NOTE — Progress Notes (Signed)
  Subjective:    Patient ID: Bethany Griffin, female    DOB: 11/18/1960, 50 y.o.   MRN: [id number]  HPI dysuria x 3 days   Review of Systems     Objective:   Physical Exam        Assessment & Plan:  Dysuria- UA normal.  Have pt increase her water intake and use OTC AZO x 2 days.  If symptoms continue, she will need OV.

## 2010-08-06 ENCOUNTER — Encounter: Payer: Self-pay | Admitting: Family Medicine

## 2010-08-06 ENCOUNTER — Ambulatory Visit: Payer: PRIVATE HEALTH INSURANCE | Admitting: Family Medicine

## 2010-08-06 ENCOUNTER — Ambulatory Visit (INDEPENDENT_AMBULATORY_CARE_PROVIDER_SITE_OTHER): Payer: PRIVATE HEALTH INSURANCE | Admitting: Family Medicine

## 2010-08-06 VITALS — BP 103/66 | HR 82 | Ht 63.0 in | Wt 183.0 lb

## 2010-08-06 DIAGNOSIS — M7710 Lateral epicondylitis, unspecified elbow: Secondary | ICD-10-CM

## 2010-08-06 DIAGNOSIS — E785 Hyperlipidemia, unspecified: Secondary | ICD-10-CM

## 2010-08-06 DIAGNOSIS — R635 Abnormal weight gain: Secondary | ICD-10-CM

## 2010-08-06 NOTE — Progress Notes (Signed)
  Subjective:    Patient ID: Bethany Griffin, female    DOB: 1960-09-17, 50 y.o.   MRN: [id number]  HPI 50 yo WF presents for f/u high cholesterol.  I stopped her Crestor on 3-7 due to myalgias.  I stopped her phentermine because she gained weight on it the last month and was not doing the diet and exercise part of her weight loss plan.  She is up from 179 to 183 today.  She feels like she is hungry all the time.  She has not been exercising other than yardwork.  Denies chest pain or DOE.   Her muscle aches did improve other than some residual R elbow pain that was not really bothering her before.  She is R handed.  Denies redness, heat or swelling.  Not bad enough that she's had to take anything for it.    BP 103/66  Pulse 82  Ht 5\' 3"  (1.6 m)  Wt 183 lb (83.008 kg)  BMI 32.42 kg/m2  SpO2 98%    Review of Systems  Constitutional: Positive for appetite change. Negative for fever and fatigue.  Eyes: Negative for visual disturbance.  Respiratory: Negative for shortness of breath.   Cardiovascular: Negative for chest pain, palpitations and leg swelling.  Musculoskeletal: Positive for arthralgias. Negative for myalgias, back pain and joint swelling.  Psychiatric/Behavioral: Negative for sleep disturbance and dysphoric mood. The patient is not nervous/anxious.        Objective:   Physical Exam  Constitutional: She appears well-developed and well-nourished. No distress.       obese  Eyes: No scleral icterus.  Neck: Neck supple. No thyromegaly present.  Cardiovascular: Normal rate, regular rhythm and normal heart sounds.   Pulmonary/Chest: Effort normal and breath sounds normal.  Musculoskeletal: Normal range of motion. She exhibits tenderness (tender over R lateral olecranon process over insertion of tendon laterally, full R elbow ROM).  Skin: Skin is warm and dry.  Psychiatric: She has a normal mood and affect.          Assessment & Plan:  1. R elbow pain.  Unlikely from statin  since all other joint pains improved. Likely lateral epicondylitis.  Recommend treating with a Cho Pat strap, NSAIDs, ice and relative rest and if not improved in 3 wks, she can call me for a referral to sports med for possible injection.  2.  Hyperlipidemia - her arhtralgias for the most part are much improved off simvastatin.  Will check her lipids today along with a CK level.  She will work on diet, exercise and take Omega 3 Fish oil for now and then recheck in 4-6 mos.    3.  Weight gain - pt continues to gain weight but does not seem motivated to make the essential changes to diet and exercise.

## 2010-08-06 NOTE — Patient Instructions (Signed)
Stay off Crestor. Use Omega 3 fish oil 4 grams/ day for lipids.  Work on [employer], regular exercise.  Aim for 1 hr 5 days/ wk.  Use a CHO-PAT strap for R elbow (lateral epicondylitis or tennis elbow) during the day.  Ice 15 min at the end of the workday. Call if not improving in 3 wks and I'll get you in with sports medicine.  Return in 3 mos for f/u.

## 2010-08-07 ENCOUNTER — Encounter: Payer: Self-pay | Admitting: Family Medicine

## 2010-11-10 ENCOUNTER — Encounter: Payer: Self-pay | Admitting: Family Medicine

## 2010-11-15 ENCOUNTER — Encounter: Payer: Self-pay | Admitting: Family Medicine

## 2010-11-15 ENCOUNTER — Ambulatory Visit (INDEPENDENT_AMBULATORY_CARE_PROVIDER_SITE_OTHER): Payer: PRIVATE HEALTH INSURANCE | Admitting: Family Medicine

## 2010-11-15 DIAGNOSIS — M7710 Lateral epicondylitis, unspecified elbow: Secondary | ICD-10-CM

## 2010-11-15 DIAGNOSIS — M7711 Lateral epicondylitis, right elbow: Secondary | ICD-10-CM

## 2010-11-15 DIAGNOSIS — E785 Hyperlipidemia, unspecified: Secondary | ICD-10-CM

## 2010-11-15 DIAGNOSIS — E669 Obesity, unspecified: Secondary | ICD-10-CM

## 2010-11-15 NOTE — Patient Instructions (Signed)
Stay on current meds.  Try to do a meal replacement at night for dinner - either a fruit and veggie smoothie or a slim fast shake, plenty of water and 30 + min of walking 5 days/ wk.  Return for f/u with fasting labs in 3 mos.  Update your mammogram.

## 2010-11-15 NOTE — Progress Notes (Signed)
  Subjective:    Patient ID: Bethany Griffin, female    DOB: 1960/05/25, 50 y.o.   MRN: [id number]  HPI 50 yo WF presents for f/u obesity and hyperlipidemia.  She has gained another 20 lbs in 3 mos.  She sleeps a good 8 hrs at night but feels tired all the time.  She is eating more foods that she should not be eating and not exercising.  Her R elbow Tendonitis has improved with use of the Cho Pat strap.  She is doing well on Omega 3 Fish Oil, off statins due to myalgias and will be due to have her cholesterol rechecked in another 2-3 mos.  She admits to emotional eating, binging on carbs/ sweets at night.  Taking Wellbutrin everyday.  BP 127/76  Pulse 78  Ht 4\' 11"  (1.499 m)  Wt 203 lb (92.08 kg)  BMI 41.00 kg/m2    Review of Systems  Constitutional: Positive for fatigue and unexpected weight change.  Eyes: Negative for visual disturbance.  Respiratory: Negative for shortness of breath.   Cardiovascular: Negative for chest pain, palpitations and leg swelling.       Objective:   Physical Exam  Constitutional: She appears well-developed and well-nourished.       obese  HENT:  Mouth/Throat: Oropharynx is clear and moist.  Neck: Neck supple. No thyromegaly present.  Cardiovascular: Normal rate, regular rhythm and normal heart sounds.   No murmur heard. Pulmonary/Chest: Effort normal and breath sounds normal. No respiratory distress.  Musculoskeletal: She exhibits no edema.  Psychiatric: She has a normal mood and affect.          Assessment & Plan:  R lateral epicondylitis:  Improved with use of cho pat strap.

## 2010-11-15 NOTE — Assessment & Plan Note (Signed)
BMI 41 c/w class III obesity complicated by dyslipidemia and depression. She has continued to gain more weight since her last visit.  She is motivated to try to make some changes.  Will start with meal replacement at night, plenty of water and a 30+ min walk on the treadmill after dinner.  She is due to f/u with fasting lipids/ B12 in 2-3 mos.

## 2010-11-15 NOTE — Assessment & Plan Note (Signed)
Had myalgias from statins.  Definitely needs to work on lifestyle changes and wt loss and should stay on Omega 3 Fish Oil daily.  Recheck labs in 2-3 mos.

## 2010-11-25 ENCOUNTER — Other Ambulatory Visit: Payer: Self-pay | Admitting: Family Medicine

## 2010-11-25 DIAGNOSIS — Z1231 Encounter for screening mammogram for malignant neoplasm of breast: Secondary | ICD-10-CM

## 2010-12-07 ENCOUNTER — Other Ambulatory Visit: Payer: Self-pay | Admitting: *Deleted

## 2010-12-07 MED ORDER — BUPROPION HCL ER (XL) 300 MG PO TB24: 300.0000 mg | ORAL_TABLET | ORAL | Status: DC

## 2010-12-28 ENCOUNTER — Ambulatory Visit
Admission: RE | Admit: 2010-12-28 | Discharge: 2010-12-28 | Disposition: A | Discharge status: Home / Self Care | Payer: PRIVATE HEALTH INSURANCE | Source: Ambulatory Visit | Attending: Family Medicine | Admitting: Family Medicine

## 2010-12-28 DIAGNOSIS — Z1231 Encounter for screening mammogram for malignant neoplasm of breast: Secondary | ICD-10-CM

## 2011-01-04 ENCOUNTER — Ambulatory Visit: Payer: PRIVATE HEALTH INSURANCE

## 2011-03-17 ENCOUNTER — Ambulatory Visit: Payer: PRIVATE HEALTH INSURANCE | Admitting: Family Medicine

## 2011-05-08 ENCOUNTER — Other Ambulatory Visit: Payer: Self-pay | Admitting: Family Medicine

## 2011-05-09 NOTE — Telephone Encounter (Signed)
Needs appointment

## 2011-06-01 ENCOUNTER — Other Ambulatory Visit: Payer: Self-pay | Admitting: Family Medicine

## 2011-06-02 NOTE — Telephone Encounter (Signed)
Needs appointment

## 2012-04-02 ENCOUNTER — Other Ambulatory Visit: Payer: Self-pay | Admitting: Family Medicine

## 2012-04-02 DIAGNOSIS — Z1231 Encounter for screening mammogram for malignant neoplasm of breast: Secondary | ICD-10-CM

## 2012-04-26 ENCOUNTER — Ambulatory Visit (INDEPENDENT_AMBULATORY_CARE_PROVIDER_SITE_OTHER): Payer: 59

## 2012-04-26 DIAGNOSIS — Z1231 Encounter for screening mammogram for malignant neoplasm of breast: Secondary | ICD-10-CM

## 2013-04-05 ENCOUNTER — Other Ambulatory Visit: Payer: Self-pay | Admitting: *Deleted

## 2013-04-05 ENCOUNTER — Other Ambulatory Visit: Payer: Self-pay | Admitting: Family Medicine

## 2013-04-05 DIAGNOSIS — Z1231 Encounter for screening mammogram for malignant neoplasm of breast: Secondary | ICD-10-CM

## 2013-04-05 DIAGNOSIS — Z780 Asymptomatic menopausal state: Secondary | ICD-10-CM

## 2013-04-30 ENCOUNTER — Ambulatory Visit: Payer: 59

## 2013-04-30 ENCOUNTER — Other Ambulatory Visit: Payer: 59

## 2018-02-27 ENCOUNTER — Encounter (HOSPITAL_COMMUNITY): Payer: Self-pay

## 2018-02-27 ENCOUNTER — Emergency Department (HOSPITAL_COMMUNITY)
Admission: EM | Admit: 2018-02-27 | Discharge: 2018-02-27 | Disposition: A | Discharge status: Home / Self Care | Payer: 59 | Attending: Emergency Medicine | Admitting: Emergency Medicine

## 2018-02-27 ENCOUNTER — Emergency Department (HOSPITAL_COMMUNITY): Payer: 59

## 2018-02-27 ENCOUNTER — Other Ambulatory Visit: Payer: Self-pay

## 2018-02-27 DIAGNOSIS — Y9229 Other specified public building as the place of occurrence of the external cause: Secondary | ICD-10-CM | POA: Insufficient documentation

## 2018-02-27 DIAGNOSIS — Z79899 Other long term (current) drug therapy: Secondary | ICD-10-CM | POA: Insufficient documentation

## 2018-02-27 DIAGNOSIS — Y939 Activity, unspecified: Secondary | ICD-10-CM | POA: Diagnosis not present

## 2018-02-27 DIAGNOSIS — S82142A Displaced bicondylar fracture of left tibia, initial encounter for closed fracture: Secondary | ICD-10-CM

## 2018-02-27 DIAGNOSIS — Y999 Unspecified external cause status: Secondary | ICD-10-CM | POA: Diagnosis not present

## 2018-02-27 DIAGNOSIS — S82102A Unspecified fracture of upper end of left tibia, initial encounter for closed fracture: Secondary | ICD-10-CM | POA: Insufficient documentation

## 2018-02-27 DIAGNOSIS — S8992XA Unspecified injury of left lower leg, initial encounter: Secondary | ICD-10-CM | POA: Diagnosis present

## 2018-02-27 MED ORDER — NAPROXEN 500 MG PO TABS: 500.0000 mg | ORAL_TABLET | Freq: Two times a day (BID) | ORAL | 0 refills | Status: DC

## 2018-02-27 MED ORDER — IBUPROFEN 200 MG PO TABS
600.0000 mg | ORAL_TABLET | Freq: Once | ORAL | Status: AC
  Administered 2018-02-27: 600 mg via ORAL
  Filled 2018-02-27: qty 3

## 2018-02-27 MED ORDER — OXYCODONE-ACETAMINOPHEN 5-325 MG PO TABS
1.0000 | ORAL_TABLET | Freq: Once | ORAL | Status: AC
  Administered 2018-02-27: 1 via ORAL

## 2018-02-27 MED ORDER — HYDROCODONE-ACETAMINOPHEN 5-325 MG PO TABS: 1.0000 | ORAL_TABLET | ORAL | 0 refills | Status: DC | PRN

## 2018-02-27 NOTE — Discharge Instructions (Signed)
Call Dr. Deri FuellingAluisio's office in the morning to schedule follow up. Return her sooner for any problems.
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
## 2019-01-07 ENCOUNTER — Other Ambulatory Visit: Payer: Self-pay | Admitting: Orthopedic Surgery

## 2019-01-07 DIAGNOSIS — G8929 Other chronic pain: Secondary | ICD-10-CM

## 2019-01-08 ENCOUNTER — Ambulatory Visit
Admission: RE | Admit: 2019-01-08 | Discharge: 2019-01-08 | Disposition: A | Discharge status: Home / Self Care | Payer: Self-pay | Source: Ambulatory Visit | Attending: Orthopedic Surgery | Admitting: Orthopedic Surgery

## 2019-01-08 ENCOUNTER — Other Ambulatory Visit: Payer: Self-pay

## 2019-01-08 DIAGNOSIS — G8929 Other chronic pain: Secondary | ICD-10-CM

## 2019-01-18 ENCOUNTER — Other Ambulatory Visit (HOSPITAL_COMMUNITY)
Admission: RE | Admit: 2019-01-18 | Discharge: 2019-01-18 | Disposition: A | Discharge status: Home / Self Care | Payer: No Typology Code available for payment source | Source: Ambulatory Visit | Attending: Orthopedic Surgery | Admitting: Orthopedic Surgery

## 2019-01-18 DIAGNOSIS — Z01812 Encounter for preprocedural laboratory examination: Secondary | ICD-10-CM | POA: Diagnosis present

## 2019-01-18 DIAGNOSIS — Z20828 Contact with and (suspected) exposure to other viral communicable diseases: Secondary | ICD-10-CM | POA: Insufficient documentation

## 2019-01-19 LAB — NOVEL CORONAVIRUS, NAA (HOSP ORDER, SEND-OUT TO REF LAB; TAT 18-24 HRS): SARS-CoV-2, NAA: NOT DETECTED

## 2019-01-21 ENCOUNTER — Encounter (HOSPITAL_COMMUNITY): Payer: Self-pay | Admitting: *Deleted

## 2019-01-21 ENCOUNTER — Other Ambulatory Visit: Payer: Self-pay

## 2019-01-21 NOTE — Progress Notes (Signed)
PCP - Dr. Lorenza Evangelist Cardiologist - no  Chest x-ray - no EKG - done in PCP's office will forward to Dr. Luanna Cole office Stress Test - no ECHO - no Cardiac Cath - no  Sleep Study - no CPAP -   Fasting Blood Sugar - NA Checks Blood Sugar _____ times a day  Blood Thinner Instructions:no Aspirin Instructions: Last Dose:  Anesthesia review: Pt is a Same Day she was not seen in PST.   Patient denies shortness of breath, fever, cough and chest pain at PAT appointment yes  Patient verbalized understanding of instructions that were given to them at the PAT appointment. Patient was also instructed that they will need to review over the PAT instructions again at home before surgery. Yes  Pt has a BMI of 54.53 anesthesia will have to evaluate pt DOS.

## 2019-01-22 ENCOUNTER — Ambulatory Visit (HOSPITAL_COMMUNITY): Payer: No Typology Code available for payment source

## 2019-01-22 ENCOUNTER — Encounter (HOSPITAL_COMMUNITY)
Admission: RE | Disposition: A | Discharge status: Home / Self Care | Payer: Self-pay | Source: Home / Self Care | Attending: Orthopedic Surgery

## 2019-01-22 ENCOUNTER — Ambulatory Visit (HOSPITAL_COMMUNITY): Payer: No Typology Code available for payment source | Attending: Orthopedic Surgery

## 2019-01-22 ENCOUNTER — Ambulatory Visit (HOSPITAL_COMMUNITY)
Admission: RE | Admit: 2019-01-22 | Discharge: 2019-01-22 | Disposition: A | Discharge status: Home / Self Care | Payer: No Typology Code available for payment source | Attending: Orthopedic Surgery | Admitting: Orthopedic Surgery

## 2019-01-22 ENCOUNTER — Ambulatory Visit (HOSPITAL_COMMUNITY): Payer: No Typology Code available for payment source | Admitting: Certified Registered Nurse Anesthetist

## 2019-01-22 ENCOUNTER — Encounter (HOSPITAL_COMMUNITY): Payer: Self-pay | Admitting: *Deleted

## 2019-01-22 DIAGNOSIS — S42201A Unspecified fracture of upper end of right humerus, initial encounter for closed fracture: Secondary | ICD-10-CM

## 2019-01-22 DIAGNOSIS — Z419 Encounter for procedure for purposes other than remedying health state, unspecified: Secondary | ICD-10-CM

## 2019-01-22 DIAGNOSIS — S42251A Displaced fracture of greater tuberosity of right humerus, initial encounter for closed fracture: Secondary | ICD-10-CM | POA: Diagnosis present

## 2019-01-22 DIAGNOSIS — Z79899 Other long term (current) drug therapy: Secondary | ICD-10-CM | POA: Insufficient documentation

## 2019-01-22 DIAGNOSIS — W172XXA Fall into hole, initial encounter: Secondary | ICD-10-CM | POA: Insufficient documentation

## 2019-01-22 DIAGNOSIS — S42301A Unspecified fracture of shaft of humerus, right arm, initial encounter for closed fracture: Secondary | ICD-10-CM | POA: Insufficient documentation

## 2019-01-22 DIAGNOSIS — Z6841 Body Mass Index (BMI) 40.0 and over, adult: Secondary | ICD-10-CM | POA: Diagnosis not present

## 2019-01-22 DIAGNOSIS — X58XXXA Exposure to other specified factors, initial encounter: Secondary | ICD-10-CM | POA: Insufficient documentation

## 2019-01-22 DIAGNOSIS — F419 Anxiety disorder, unspecified: Secondary | ICD-10-CM | POA: Diagnosis not present

## 2019-01-22 DIAGNOSIS — S42211A Unspecified displaced fracture of surgical neck of right humerus, initial encounter for closed fracture: Secondary | ICD-10-CM | POA: Diagnosis not present

## 2019-01-22 DIAGNOSIS — Y990 Civilian activity done for income or pay: Secondary | ICD-10-CM | POA: Diagnosis not present

## 2019-01-22 DIAGNOSIS — S42261A Displaced fracture of lesser tuberosity of right humerus, initial encounter for closed fracture: Secondary | ICD-10-CM | POA: Insufficient documentation

## 2019-01-22 DIAGNOSIS — F329 Major depressive disorder, single episode, unspecified: Secondary | ICD-10-CM | POA: Diagnosis not present

## 2019-01-22 DIAGNOSIS — E785 Hyperlipidemia, unspecified: Secondary | ICD-10-CM | POA: Diagnosis not present

## 2019-01-22 HISTORY — DX: Other specified postprocedural states: Z98.890

## 2019-01-22 HISTORY — DX: Unspecified fracture of upper end of right humerus, initial encounter for closed fracture: S42.201A

## 2019-01-22 HISTORY — DX: Depression, unspecified: F32.A

## 2019-01-22 HISTORY — DX: Nausea with vomiting, unspecified: R11.2

## 2019-01-22 HISTORY — PX: ORIF HUMERUS FRACTURE: SHX2126

## 2019-01-22 HISTORY — DX: Anxiety disorder, unspecified: F41.9

## 2019-01-22 LAB — COMPREHENSIVE METABOLIC PANEL
ALT: 19 U/L (ref 0–44)
AST: 15 U/L (ref 15–41)
Albumin: 4 g/dL (ref 3.5–5.0)
Alkaline Phosphatase: 150 U/L — ABNORMAL HIGH (ref 38–126)
Anion gap: 9 (ref 5–15)
BUN: 9 mg/dL (ref 6–20)
CO2: 24 mmol/L (ref 22–32)
Calcium: 9 mg/dL (ref 8.9–10.3)
Chloride: 105 mmol/L (ref 98–111)
Creatinine, Ser: 0.68 mg/dL (ref 0.44–1.00)
GFR calc Af Amer: >60 mL/min (ref >60)
GFR calc non Af Amer: >60 mL/min (ref >60)
Glucose, Bld: 102 mg/dL — ABNORMAL HIGH (ref 70–99)
Potassium: 4.2 mmol/L (ref 3.5–5.1)
Sodium: 138 mmol/L (ref 135–145)
Total Bilirubin: 0.5 mg/dL (ref 0.3–1.2)
Total Protein: 7.1 g/dL (ref 6.5–8.1)

## 2019-01-22 LAB — CBC
HCT: 41.2 % (ref 36.0–46.0)
Hemoglobin: 13 g/dL (ref 12.0–15.0)
MCH: 28.5 pg (ref 26.0–34.0)
MCHC: 31.6 g/dL (ref 30.0–36.0)
MCV: 90.4 fL (ref 80.0–100.0)
Platelets: 248 10*3/uL (ref 150–400)
RBC: 4.56 MIL/uL (ref 3.87–5.11)
RDW: 13.4 % (ref 11.5–15.5)
WBC: 9.9 10*3/uL (ref 4.0–10.5)
nRBC: 0 % (ref 0.0–0.2)

## 2019-01-22 LAB — HEMOGLOBIN A1C
Hgb A1c MFr Bld: 5.6 % (ref 4.8–5.6)
Mean Plasma Glucose: 114.02 mg/dL

## 2019-01-22 SURGERY — OPEN REDUCTION INTERNAL FIXATION (ORIF) PROXIMAL HUMERUS FRACTURE
Anesthesia: General | Site: Arm Upper | Laterality: Right

## 2019-01-22 MED ORDER — MIDAZOLAM HCL 2 MG/2ML IJ SOLN
INTRAMUSCULAR | Status: AC
  Filled 2019-01-22: qty 2

## 2019-01-22 MED ORDER — FENTANYL CITRATE (PF) 100 MCG/2ML IJ SOLN
INTRAMUSCULAR | Status: AC
  Filled 2019-01-22: qty 2

## 2019-01-22 MED ORDER — 0.9 % SODIUM CHLORIDE (POUR BTL) OPTIME
TOPICAL | Status: DC | PRN
  Administered 2019-01-22: 1000 mL

## 2019-01-22 MED ORDER — FENTANYL CITRATE (PF) 100 MCG/2ML IJ SOLN
INTRAMUSCULAR | Status: DC | PRN
  Administered 2019-01-22: 50 ug via INTRAVENOUS
  Administered 2019-01-22 (×2): 25 ug via INTRAVENOUS
  Administered 2019-01-22: 50 ug via INTRAVENOUS
  Administered 2019-01-22 (×2): 25 ug via INTRAVENOUS

## 2019-01-22 MED ORDER — CHLORHEXIDINE GLUCONATE 4 % EX LIQD: 60.0000 mL | Freq: Once | CUTANEOUS | Status: DC

## 2019-01-22 MED ORDER — SODIUM CHLORIDE 0.9 % IV SOLN
INTRAVENOUS | Status: DC | PRN
  Administered 2019-01-22: 14:00:00 40 ug/min via INTRAVENOUS

## 2019-01-22 MED ORDER — LACTATED RINGERS IV SOLN
INTRAVENOUS | Status: DC
  Administered 2019-01-22: 13:00:00 via INTRAVENOUS

## 2019-01-22 MED ORDER — EPHEDRINE SULFATE-NACL 50-0.9 MG/10ML-% IV SOSY
PREFILLED_SYRINGE | INTRAVENOUS | Status: DC | PRN
  Administered 2019-01-22 (×2): 10 mg via INTRAVENOUS

## 2019-01-22 MED ORDER — MIDAZOLAM HCL 2 MG/2ML IJ SOLN
1.0000 mg | Freq: Once | INTRAMUSCULAR | Status: AC
  Administered 2019-01-22: 1 mg via INTRAVENOUS
  Filled 2019-01-22: qty 2

## 2019-01-22 MED ORDER — ROCURONIUM BROMIDE 10 MG/ML (PF) SYRINGE
PREFILLED_SYRINGE | INTRAVENOUS | Status: AC
  Filled 2019-01-22: qty 10

## 2019-01-22 MED ORDER — BUPIVACAINE HCL (PF) 0.5 % IJ SOLN
INTRAMUSCULAR | Status: DC | PRN
  Administered 2019-01-22: 15 mL via PERINEURAL

## 2019-01-22 MED ORDER — CEFAZOLIN SODIUM-DEXTROSE 2-4 GM/100ML-% IV SOLN
2.0000 g | INTRAVENOUS | Status: AC
  Administered 2019-01-22: 2 g via INTRAVENOUS
  Filled 2019-01-22: qty 100

## 2019-01-22 MED ORDER — MIDAZOLAM HCL 5 MG/5ML IJ SOLN
INTRAMUSCULAR | Status: DC | PRN
  Administered 2019-01-22 (×2): 1 mg via INTRAVENOUS

## 2019-01-22 MED ORDER — STERILE WATER FOR IRRIGATION IR SOLN
Status: DC | PRN
  Administered 2019-01-22: 1000 mL

## 2019-01-22 MED ORDER — FENTANYL CITRATE (PF) 100 MCG/2ML IJ SOLN
50.0000 ug | Freq: Once | INTRAMUSCULAR | Status: AC
  Administered 2019-01-22: 50 ug via INTRAVENOUS
  Filled 2019-01-22: qty 2

## 2019-01-22 MED ORDER — ACETAMINOPHEN 500 MG PO TABS
1000.0000 mg | ORAL_TABLET | Freq: Once | ORAL | Status: AC
  Administered 2019-01-22: 1000 mg via ORAL
  Filled 2019-01-22: qty 2

## 2019-01-22 MED ORDER — ONDANSETRON HCL 4 MG/2ML IJ SOLN
INTRAMUSCULAR | Status: DC | PRN
  Administered 2019-01-22: 4 mg via INTRAVENOUS

## 2019-01-22 MED ORDER — LIDOCAINE 2% (20 MG/ML) 5 ML SYRINGE
INTRAMUSCULAR | Status: AC
  Filled 2019-01-22: qty 5

## 2019-01-22 MED ORDER — SUGAMMADEX SODIUM 500 MG/5ML IV SOLN
INTRAVENOUS | Status: DC | PRN
  Administered 2019-01-22: 250 mg via INTRAVENOUS

## 2019-01-22 MED ORDER — PROPOFOL 10 MG/ML IV BOLUS
INTRAVENOUS | Status: DC | PRN
  Administered 2019-01-22: 120 mg via INTRAVENOUS

## 2019-01-22 MED ORDER — ROCURONIUM BROMIDE 50 MG/5ML IV SOSY
PREFILLED_SYRINGE | INTRAVENOUS | Status: DC | PRN
  Administered 2019-01-22: 100 mg via INTRAVENOUS

## 2019-01-22 MED ORDER — MEPERIDINE HCL 50 MG/ML IJ SOLN: 6.2500 mg | INTRAMUSCULAR | Status: DC | PRN

## 2019-01-22 MED ORDER — LIDOCAINE 2% (20 MG/ML) 5 ML SYRINGE
INTRAMUSCULAR | Status: DC | PRN
  Administered 2019-01-22: 100 mg via INTRAVENOUS

## 2019-01-22 MED ORDER — PROPOFOL 10 MG/ML IV BOLUS
INTRAVENOUS | Status: AC
  Filled 2019-01-22: qty 20

## 2019-01-22 MED ORDER — TRANEXAMIC ACID-NACL 1000-0.7 MG/100ML-% IV SOLN
1000.0000 mg | INTRAVENOUS | Status: AC
  Administered 2019-01-22: 1000 mg via INTRAVENOUS
  Filled 2019-01-22: qty 100

## 2019-01-22 MED ORDER — ONDANSETRON HCL 4 MG/2ML IJ SOLN
4.0000 mg | Freq: Once | INTRAMUSCULAR | Status: AC | PRN
  Administered 2019-01-22: 4 mg via INTRAVENOUS

## 2019-01-22 MED ORDER — DEXAMETHASONE SODIUM PHOSPHATE 10 MG/ML IJ SOLN
INTRAMUSCULAR | Status: DC | PRN
  Administered 2019-01-22: 5 mg via INTRAVENOUS

## 2019-01-22 MED ORDER — ONDANSETRON HCL 4 MG/2ML IJ SOLN
INTRAMUSCULAR | Status: AC
  Filled 2019-01-22: qty 2

## 2019-01-22 MED ORDER — BUPIVACAINE LIPOSOME 1.3 % IJ SUSP
INTRAMUSCULAR | Status: DC | PRN
  Administered 2019-01-22: 10 mL via PERINEURAL

## 2019-01-22 MED ORDER — HYDROMORPHONE HCL 1 MG/ML IJ SOLN: 0.2500 mg | INTRAMUSCULAR | Status: DC | PRN

## 2019-01-22 SURGICAL SUPPLY — 62 items
BENZOIN TINCTURE PRP APPL 2/3 (GAUZE/BANDAGES/DRESSINGS) ×4 IMPLANT
BIT DRILL 3.2 (BIT) ×1
BIT DRILL 3.2XCALB NS DISP (BIT) IMPLANT
BIT DRILL CALIBRATED 2.7 (BIT) ×1 IMPLANT
BIT DRL 3.2XCALB NS DISP (BIT) ×1
BNDG GAUZE ELAST 4 BULKY (GAUZE/BANDAGES/DRESSINGS) ×4 IMPLANT
CLSR STERI-STRIP ANTIMIC 1/2X4 (GAUZE/BANDAGES/DRESSINGS) ×1 IMPLANT
COVER SURGICAL LIGHT HANDLE (MISCELLANEOUS) ×2 IMPLANT
COVER WAND RF STERILE (DRAPES) IMPLANT
DRAPE C-ARM 42X120 X-RAY (DRAPES) ×2 IMPLANT
DRAPE INCISE IOBAN 66X45 STRL (DRAPES) IMPLANT
DRAPE SURG 17X11 SM STRL (DRAPES) ×4 IMPLANT
DRAPE U-SHAPE 47X51 STRL (DRAPES) ×2 IMPLANT
DRSG ADAPTIC 3X8 NADH LF (GAUZE/BANDAGES/DRESSINGS) ×2 IMPLANT
DRSG MEPILEX BORDER 4X8 (GAUZE/BANDAGES/DRESSINGS) ×1 IMPLANT
DRSG PAD ABDOMINAL 8X10 ST (GAUZE/BANDAGES/DRESSINGS) ×4 IMPLANT
ELECT REM PT RETURN 15FT ADLT (MISCELLANEOUS) ×2 IMPLANT
EVACUATOR 1/8 PVC DRAIN (DRAIN) IMPLANT
GAUZE SPONGE 4X4 12PLY STRL (GAUZE/BANDAGES/DRESSINGS) ×4 IMPLANT
GLOVE BIO SURGEON STRL SZ7.5 (GLOVE) ×2 IMPLANT
GLOVE BIOGEL PI IND STRL 8 (GLOVE) ×1 IMPLANT
GLOVE BIOGEL PI INDICATOR 8 (GLOVE) ×1
GOWN STRL REUS W/TWL LRG LVL3 (GOWN DISPOSABLE) ×4 IMPLANT
K-WIRE 2X5 SS THRDED S3 (WIRE) ×4
KIT BASIN OR (CUSTOM PROCEDURE TRAY) ×2 IMPLANT
KIT TURNOVER KIT A (KITS) IMPLANT
KWIRE 2X5 SS THRDED S3 (WIRE) IMPLANT
MANIFOLD NEPTUNE II (INSTRUMENTS) ×2 IMPLANT
NS IRRIG 1000ML POUR BTL (IV SOLUTION) ×2 IMPLANT
PACK ORTHO EXTREMITY (CUSTOM PROCEDURE TRAY) ×2 IMPLANT
PEG LOCKING 3.2MMX46 (Peg) ×1 IMPLANT
PEG LOCKING 3.2MMX54 (Peg) ×1 IMPLANT
PEG LOCKING 3.2X34 (Screw) ×2 IMPLANT
PEG LOCKING 3.2X36 (Screw) ×1 IMPLANT
PEG LOCKING 3.2X40 (Peg) ×1 IMPLANT
PEG LOCKING 3.2X42 (Screw) ×1 IMPLANT
PEG LOCKING 3.2X50 (Screw) ×2 IMPLANT
PLATE 3HOLE HUMERUS PROX RT (Plate) ×1 IMPLANT
PROTECTOR NERVE ULNAR (MISCELLANEOUS) ×2 IMPLANT
SCREW LOCK CORT STAR 3.5X24 (Screw) ×1 IMPLANT
SCREW LP NL T15 3.5X24 (Screw) ×1 IMPLANT
SCREW LP NL T15 3.5X26 (Screw) ×1 IMPLANT
SLING ARM IMMOBILIZER LRG (SOFTGOODS) ×2 IMPLANT
SLING ARM IMMOBILIZER MED (SOFTGOODS) IMPLANT
SOL PREP POV-IOD 4OZ 10% (MISCELLANEOUS) ×2 IMPLANT
SOL PREP PROV IODINE SCRUB 4OZ (MISCELLANEOUS) ×2 IMPLANT
SPONGE LAP 18X18 RF (DISPOSABLE) IMPLANT
STAPLER VISISTAT 35W (STAPLE) IMPLANT
STOCKINETTE 8 INCH (MISCELLANEOUS) ×2 IMPLANT
SUCTION FRAZIER HANDLE 10FR (MISCELLANEOUS) ×1
SUCTION TUBE FRAZIER 10FR DISP (MISCELLANEOUS) ×1 IMPLANT
SUT ETHIBOND 2 0 SH (SUTURE) ×1
SUT ETHIBOND 2 0 SH 36X2 (SUTURE) ×1 IMPLANT
SUT FIBERWIRE #2 38 REV NDL BL (SUTURE) ×6
SUT MNCRL AB 4-0 PS2 18 (SUTURE) ×2 IMPLANT
SUT VIC AB 0 CT1 36 (SUTURE) ×2 IMPLANT
SUT VIC AB 3-0 SH 18 (SUTURE) ×2 IMPLANT
SUT VIC AB 4-0 PS2 27 (SUTURE) ×2 IMPLANT
SUTURE FIBERWR#2 38 REV NDL BL (SUTURE) IMPLANT
TOWEL OR 17X26 10 PK STRL BLUE (TOWEL DISPOSABLE) ×4 IMPLANT
TRAY FOLEY MTR SLVR 16FR STAT (SET/KITS/TRAYS/PACK) IMPLANT
WATER STERILE IRR 1000ML POUR (IV SOLUTION) ×2 IMPLANT

## 2019-01-22 NOTE — Anesthesia Preprocedure Evaluation (Addendum)
Anesthesia Evaluation  Patient identified by MRN, date of birth, ID band Patient awake    Reviewed: Allergy & Precautions, NPO status , Patient's Chart, lab work & pertinent test results  History of Anesthesia Complications (+) PONV and history of anesthetic complications  Airway Mallampati: III  TM Distance: >3 FB Neck ROM: Full    Dental  (+) Dental Advisory Given, Upper Dentures   Pulmonary neg pulmonary ROS,    Pulmonary exam normal        Cardiovascular negative cardio ROS Normal cardiovascular exam     Neuro/Psych PSYCHIATRIC DISORDERS Anxiety Depression negative neurological ROS     GI/Hepatic negative GI ROS, Neg liver ROS,   Endo/Other  Morbid obesity  Renal/GU negative Renal ROS     Musculoskeletal negative musculoskeletal ROS (+)   Abdominal (+) + obese,   Peds  Hematology negative hematology ROS (+)   Anesthesia Other Findings   Reproductive/Obstetrics                            Anesthesia Physical Anesthesia Plan  ASA: III  Anesthesia Plan: General   Post-op Pain Management:  Regional for Post-op pain   Induction: Intravenous  PONV Risk Score and Plan: 4 or greater and Treatment may vary due to age or medical condition, Ondansetron, Dexamethasone and Midazolam  Airway Management Planned: Oral ETT  Additional Equipment: None  Intra-op Plan:   Post-operative Plan: Extubation in OR  Informed Consent: I have reviewed the patients History and Physical, chart, labs and discussed the procedure including the risks, benefits and alternatives for the proposed anesthesia with the patient or authorized representative who has indicated his/her understanding and acceptance.     Dental advisory given  Plan Discussed with: CRNA and Anesthesiologist  Anesthesia Plan Comments:        Anesthesia Quick Evaluation

## 2019-01-22 NOTE — Anesthesia Postprocedure Evaluation (Signed)
Anesthesia Post Note  Patient: Bethany Griffin  Procedure(s) Performed: OPEN REDUCTION INTERNAL FIXATION (ORIF) PROXIMAL HUMERUS FRACTURE (Right Arm Upper)     Patient location during evaluation: PACU Anesthesia Type: General Level of consciousness: awake and alert Pain management: pain level controlled Vital Signs Assessment: post-procedure vital signs reviewed and stable Respiratory status: spontaneous breathing, nonlabored ventilation, respiratory function stable and patient connected to nasal cannula oxygen Cardiovascular status: blood pressure returned to baseline and stable Postop Assessment: no apparent nausea or vomiting Anesthetic complications: no    Last Vitals:  Vitals:   01/22/19 1645 01/22/19 1720  BP: 129/70 (!) 148/78  Pulse: 100 (!) 103  Resp: 19 18  Temp: 36.5 C (!) 36.3 C  SpO2: 92% 93%    Last Pain:  Vitals:   01/22/19 1720  TempSrc:   PainSc: 0-No pain                 Kammy Klett DAVID

## 2019-01-22 NOTE — Discharge Instructions (Signed)
Diet: As you were doing prior to hospitalization   Shower:  May shower but keep the wounds dry, use an occlusive plastic wrap, NO SOAKING IN TUB.  If the bandage gets wet, change with a clean dry gauze.  If you have a splint on, leave the splint in place and keep the splint dry with a plastic bag.  Dressing:  You may change your dressing 3-5 days after surgery, unless you have a splint.  If you have a splint, then just leave the splint in place and we will change your bandages during your first follow-up appointment.    If you had hand or foot surgery, we will plan to remove your stitches in about 2 weeks in the office.  For all other surgeries, there are sticky tapes (steri-strips) on your wounds and all the stitches are absorbable.  Leave the steri-strips in place when changing your dressings, they will peel off with time, usually 2-3 weeks.  Activity:  Increase activity slowly as tolerated, but follow the weight bearing instructions below.  The rules on driving is that you can not be taking narcotics while you drive, and you must feel in control of the vehicle.    Weight Bearing:   Sling at all times except for hygiene.    To prevent constipation: you may use a stool softener such as -  Colace (over the counter) 100 mg by mouth twice a day  Drink plenty of fluids (prune juice may be helpful) and high fiber foods Miralax (over the counter) for constipation as needed.    Itching:  If you experience itching with your medications, try taking only a single pain pill, or even half a pain pill at a time.  You may take up to 10 pain pills per day, and you can also use benadryl over the counter for itching or also to help with sleep.   Precautions:  If you experience chest pain or shortness of breath - call 911 immediately for transfer to the hospital emergency department!!  If you develop a fever greater that 101 F, purulent drainage from wound, increased redness or drainage from wound, or calf pain  -- Call the office at [phone]                                                Follow- Up Appointment:  Please call for an appointment to be seen in 2 weeks [place] - [phone]      General Anesthesia, Adult, Care After This sheet gives you information about how to care for yourself after your procedure. Your health care provider may also give you more specific instructions. If you have problems or questions, contact your health care provider. What can I expect after the procedure? After the procedure, the following side effects are common:  Pain or discomfort at the IV site.  Nausea.  Vomiting.  Sore throat.  Trouble concentrating.  Feeling cold or chills.  Weak or tired.  Sleepiness and fatigue.  Soreness and body aches. These side effects can affect parts of the body that were not involved in surgery. Follow these instructions at home:  For at least 24 hours after the procedure:  Have a responsible adult stay with you. It is important to have someone help care for you until you are awake and alert.  Rest as needed.  Do not: ?  Participate in activities in which you could fall or become injured. ? Drive. ? Use heavy machinery. ? Drink alcohol. ? Take sleeping pills or medicines that cause drowsiness. ? Make important decisions or sign legal documents. ? Take care of children on your own. Eating and drinking  Follow any instructions from your health care provider about eating or drinking restrictions.  When you feel hungry, start by eating small amounts of foods that are soft and easy to digest (bland), such as toast. Gradually return to your regular diet.  Drink enough fluid to keep your urine pale yellow.  If you vomit, rehydrate by drinking water, juice, or clear broth. General instructions  If you have sleep apnea, surgery and certain medicines can increase your risk for breathing problems. Follow instructions from your health care provider about  wearing your sleep device: ? Anytime you are sleeping, including during daytime naps. ? While taking prescription pain medicines, sleeping medicines, or medicines that make you drowsy.  Return to your normal activities as told by your health care provider. Ask your health care provider what activities are safe for you.  Take over-the-counter and prescription medicines only as told by your health care provider.  If you smoke, do not smoke without supervision.  Keep all follow-up visits as told by your health care provider. This is important. Contact a health care provider if:  You have nausea or vomiting that does not get better with medicine.  You cannot eat or drink without vomiting.  You have pain that does not get better with medicine.  You are unable to pass urine.  You develop a skin rash.  You have a fever.  You have redness around your IV site that gets worse. Get help right away if:  You have difficulty breathing.  You have chest pain.  You have blood in your urine or stool, or you vomit blood. Summary  After the procedure, it is common to have a sore throat or nausea. It is also common to feel tired.  Have a responsible adult stay with you for the first 24 hours after general anesthesia. It is important to have someone help care for you until you are awake and alert.  When you feel hungry, start by eating small amounts of foods that are soft and easy to digest (bland), such as toast. Gradually return to your regular diet.  Drink enough fluid to keep your urine pale yellow.  Return to your normal activities as told by your health care provider. Ask your health care provider what activities are safe for you. This information is not intended to replace advice given to you by your health care provider. Make sure you discuss any questions you have with your health care provider. Document Released: 06/20/2000 Document Revised: 03/17/2017 Document Reviewed:  10/28/2016 Elsevier Patient Education  2020 [employer].

## 2019-01-22 NOTE — H&P (Signed)
PREOPERATIVE H&P  Chief Complaint: Right shoulder pain  HPI: Bethany Griffin is a 58 y.o. female who presents for preoperative history and physical with a diagnosis of right proximal humerus fracture. Symptoms are rated as moderate to severe, and have been worsening.  This is significantly impairing activities of daily living.  She has elected for surgical management.   She had a fall on October 7, when she was on the job and stepped in a hole in the floor, lost her balance and fell.  X-rays demonstrated significant displacement of the proximal humerus fracture particular the greater tuberosity.  The head had some impaction as well.  Past Medical History:  Diagnosis Date  . Anxiety   . B12 deficiency   . Depression   . Distal radial fracture   . Early menopause   . Hyperlipidemia   . MVP (mitral valve prolapse) 2012   Echo   . Osteopenia   . PONV (postoperative nausea and vomiting)    Past Surgical History:  Procedure Laterality Date  . [place], ulnar nerve surgery 2010  . ulnar nerve surgery     Social History   Socioeconomic History  . Marital status: Married    Spouse name: Not on file  . Number of children: Not on file  . Years of education: Not on file  . Highest education level: Not on file  Occupational History  . Not on file  Social Needs  . Financial resource strain: Not on file  . Food insecurity    Worry: Not on file    Inability: Not on file  . Transportation needs    Medical: Not on file    Non-medical: Not on file  Tobacco Use  . Smoking status: Never Smoker  . Smokeless tobacco: Never Used  Substance and Sexual Activity  . Alcohol use: No  . Drug use: No  . Sexual activity: Not on file  Lifestyle  . Physical activity    Days per week: Not on file    Minutes per session: Not on file  . Stress: Not on file  Relationships  . Social Herbalist on phone: Not on file    Gets together: Not on file    Attends religious  service: Not on file    Active member of club or organization: Not on file    Attends meetings of clubs or organizations: Not on file    Relationship status: Not on file  Other Topics Concern  . Not on file  Social History Narrative  . Not on file   Family History  Problem Relation Age of Onset  . Hyperlipidemia Mother   . Hypertension Mother   . Dementia Mother   . Stroke Mother   . Hypertension Father   . Hyperlipidemia Father   . Heart disease Father        AMI   No Known Allergies Prior to Admission medications   Medication Sig Start Date End Date Taking? Authorizing Provider  buPROPion (WELLBUTRIN XL) 300 MG 24 hr tablet TAKE 1 TABLET (300 MG TOTAL) BY MOUTH EVERY MORNING. Patient taking differently: Take 300 mg by mouth every evening.  06/01/11  Yes Hali Marry, MD  clonazePAM (KLONOPIN) 0.5 MG tablet Take 0.5 mg by mouth 2 (two) times daily as needed for anxiety. 08/24/18  Yes [provider]  pravastatin (PRAVACHOL) 40 MG tablet Take 40 mg by mouth every evening. 01/08/19  Yes [provider]  calcium gluconate 500 MG tablet Take 500 mg by mouth daily.      [provider]     Positive ROS: All other systems have been reviewed and were otherwise negative with the exception of those mentioned in the HPI and as above.  Physical Exam: General: Alert, no acute distress Cardiovascular: No pedal edema Respiratory: No cyanosis, no use of accessory musculature GI: No organomegaly, abdomen is soft and non-tender Skin: No lesions in the area of chief complaint Neurologic: Sensation intact distally Psychiatric: Patient is competent for consent with normal mood and affect Lymphatic: No axillary or cervical lymphadenopathy  MUSCULOSKELETAL: Right shoulder has significant bruising, sensation intact distally, active motion was not tested.  Assessment: Right proximal humerus fracture with coexisting morbid obesity.   Plan: Plan for  Procedure(s): OPEN REDUCTION INTERNAL FIXATION (ORIF) PROXIMAL HUMERUS FRACTURE  The risks benefits and alternatives were discussed with the patient including but not limited to the risks of nonoperative treatment, versus surgical intervention including infection, bleeding, nerve injury, malunion, nonunion, the need for revision surgery, hardware prominence, hardware failure, the need for hardware removal, blood clots, cardiopulmonary complications, morbidity, mortality, among others, and they were willing to proceed.      Eulas Post, MD Cell [phone]   01/22/2019 12:42 PM

## 2019-01-22 NOTE — Progress Notes (Signed)
Assisted Dr. Brock with right, ultrasound guided, interscalene  block. Side rails up, monitors on throughout procedure. See vital signs in flow sheet. Tolerated Procedure well.  

## 2019-01-22 NOTE — Anesthesia Procedure Notes (Signed)
Anesthesia Regional Block: Interscalene brachial plexus block   Pre-Anesthetic Checklist: ,, timeout performed, Correct Patient, Correct Site, Correct Laterality, Correct Procedure, Correct Position, site marked, Risks and benefits discussed,  Surgical consent,  Pre-op evaluation,  At surgeon's request and post-op pain management  Laterality: Right  Prep: chloraprep       Needles:  Injection technique: Single-shot  Needle Type: Echogenic Needle     Needle Length: 5cm  Needle Gauge: 21     Additional Needles:   Narrative:  Start time: 01/22/2019 12:55 PM End time: 01/22/2019 12:59 PM Injection made incrementally with aspirations every 5 mL.  Performed by: Personally  Anesthesiologist: Audry Pili, MD  Additional Notes: No pain on injection. No increased resistance to injection. Injection made in 5cc increments. Good needle visualization. Patient tolerated the procedure well.

## 2019-01-22 NOTE — Addendum Note (Signed)
Addendum  created 01/22/19 2014 by West Pugh, CRNA   Flowsheet accepted, Intraprocedure Flowsheets edited

## 2019-01-22 NOTE — Transfer of Care (Signed)
Immediate Anesthesia Transfer of Care Note  Patient: Bethany Griffin  Procedure(s) Performed: OPEN REDUCTION INTERNAL FIXATION (ORIF) PROXIMAL HUMERUS FRACTURE (Right Arm Upper)  Patient Location: PACU  Anesthesia Type:GA combined with regional for post-op pain  Level of Consciousness: awake, alert  and patient cooperative  Airway & Oxygen Therapy: Patient Spontanous Breathing and Patient connected to face mask oxygen  Post-op Assessment: Report given to RN and Post -op Vital signs reviewed and stable  Post vital signs: Reviewed and stable  Last Vitals:  Vitals Value Taken Time  BP 156/79 01/22/19 1606  Temp 36.5 C 01/22/19 1606  Pulse 97 01/22/19 1609  Resp 15 01/22/19 1609  SpO2 97 % 01/22/19 1609  Vitals shown include unvalidated device data.  Last Pain:  Vitals:   01/22/19 1606  TempSrc:   PainSc: (P) Asleep         Complications: No apparent anesthesia complications

## 2019-01-22 NOTE — Anesthesia Procedure Notes (Addendum)
Procedure Name: Intubation Date/Time: 01/22/2019 1:35 PM Performed by: West Pugh, CRNA Pre-anesthesia Checklist: Patient identified, Emergency Drugs available, Suction available, Patient being monitored and Timeout performed Patient Re-evaluated:Patient Re-evaluated prior to induction Oxygen Delivery Method: Circle system utilized Preoxygenation: Pre-oxygenation with 100% oxygen Induction Type: IV induction Ventilation: Mask ventilation without difficulty Laryngoscope Size: Mac and 4 Grade View: Grade II Tube type: Oral Tube size: 7.0 mm Number of attempts: 1 Airway Equipment and Method: Stylet Placement Confirmation: ETT inserted through vocal cords under direct vision,  positive ETCO2,  CO2 detector and breath sounds checked- equal and bilateral Secured at: 21 cm Tube secured with: Tape Dental Injury: Teeth and Oropharynx as per pre-operative assessment

## 2019-01-22 NOTE — Op Note (Signed)
01/22/2019  3:41 PM  PATIENT:  Bethany Griffin    PRE-OPERATIVE DIAGNOSIS:  RIGHT PROXIMAL HUMERUS FRACTURE  POST-OPERATIVE DIAGNOSIS:  Same  PROCEDURE:  OPEN REDUCTION INTERNAL FIXATION (ORIF) PROXIMAL HUMERUS FRACTURE  SURGEON:  Eulas Post, MD  PHYSICIAN ASSISTANT: Janace Litten, OPA-C, present and scrubbed throughout the case, critical for completion in a timely fashion, and for retraction, instrumentation, and closure.  ANESTHESIA:   General with Exparel interscalene block  ESTIMATED BLOOD LOSS: 150 mL  PREOPERATIVE INDICATIONS:  Bethany Griffin is a  58 y.o. female with a diagnosis of RIGHT PROXIMAL HUMERUS FRACTURE who elected for surgical management.  She had significant impaction of the humeral head, with displacement of the tuberosity.  The risks benefits and alternatives were discussed with the patient including but not limited to the risks of nonoperative treatment, versus surgical intervention including infection, bleeding, nerve injury, malunion, nonunion, the need for revision surgery, hardware prominence, hardware failure, the need for hardware removal, blood clots, cardiopulmonary complications, conversion to arthroplasty, morbidity, mortality, among others, and they were willing to proceed.  Predicted outcome is good, although there will be at least a six to nine month expected recovery.   OPERATIVE IMPLANTS: Biomet ALPS proximal humerus locking plate.  With a total of two #2 FiberWire through the greater tuberosity, and one through the lesser tuberosity  OPERATIVE FINDINGS: There was significant impaction of the humeral head, and the articular surface is actually exposed and pointing vertically.  The lesser tuberosity was slightly displaced, but the greater tuberosity was significantly displaced posteriorly, and in fact displaced somewhat distally.  Bone quality was mediocre.  UNIQUE ASPECTS OF THE CASE: Mobilizing the tuberosity was fairly difficult.  Even after  mobilization, I wanted to remain somewhat posterior and somewhat distal, but I was able to achieve satisfactory fixation bringing it back to the shaft.  I was 50-50 on whether to convert to a reverse, but ultimately I was able to achieve appropriate head fixation, had a good subscapularis, and had fixation in the greater tuberosity.  There was a FiberWire that pulled through the tuberosity during manipulation, and created a small fracture line that was distal to the actual substance of the rotator cuff attachment.  Her BMI is 50, and access was slightly challenging.  OPERATIVE PROCEDURE: The patient was brought to the operating room and placed in the supine position. General anesthesia was administered. IV antibiotics were given. She was placed in the beach chair position. All bony prominences were padded. The upper extremity was prepped and draped in usual sterile fashion. Deltopectoral incision was performed.  I exposed the fracture site, and placed deep retractors. I did not tenotomize the biceps tendon. This was left in place. I elevated a small portion of the deltoid off of the shaft, in order to gain access for the plate. I placed supraspinatus and subscapularis stitches, and then reduced the head onto the shaft. This was maintained in satisfactory position.  I had to mobilize the head significantly, propping it up and elevating it probably almost 2 cm.  There was a significant change in the angulation of the head, getting out of the severe valgus impaction.  Mobilization of the greater tuberosity was also somewhat challenging, there was a lot of adhesions and soft tissue that had already been attached to it, I used blunt dissection, and was conscientious of the axillary nerve although did not really encounter it laterally.  I applied the plate and secured it into the sliding hole first. I  confirmed position of the reduction and the plate with C-arm, and I placed a total of 2 guidewires into the  appropriate position in the head. I was satisfied that the plate was distal appropriately, and then secured the plate proximally with smooth pegs, taking care to prevent penetration into the arch articular surface, using C-arm, as well as manual feel using a hand drill.  I then secured the plate distally using another cortical screw. I then passed the FiberWire sutures from the subscapularis and supraspinatus through the plate and secured the tuberosities. Once complete fixation and reduction of been achieved, took final C-arm pictures, and irrigated the wounds copiously, and repaired the deltopectoral interval with Vicryl followed by Vicryl for the subcutaneous tissue with Monocryl and Steri-Strips for the skin. She was placed in a sling. She had a preoperative regional block as well. She tolerated the procedure well with no complications.

## 2019-01-24 ENCOUNTER — Encounter (HOSPITAL_COMMUNITY): Payer: Self-pay | Admitting: Orthopedic Surgery

## 2019-01-25 ENCOUNTER — Encounter (HOSPITAL_COMMUNITY): Admission: RE | Admit: 2019-01-25 | Payer: Self-pay | Source: Ambulatory Visit

## 2021-06-28 DIAGNOSIS — K760 Fatty (change of) liver, not elsewhere classified: Secondary | ICD-10-CM | POA: Diagnosis not present

## 2021-06-28 DIAGNOSIS — K8020 Calculus of gallbladder without cholecystitis without obstruction: Secondary | ICD-10-CM | POA: Diagnosis not present

## 2021-06-28 DIAGNOSIS — R7401 Elevation of levels of liver transaminase levels: Secondary | ICD-10-CM | POA: Diagnosis not present

## 2021-08-04 DIAGNOSIS — H02882 Meibomian gland dysfunction right lower eyelid: Secondary | ICD-10-CM | POA: Diagnosis not present

## 2021-08-22 DIAGNOSIS — H524 Presbyopia: Secondary | ICD-10-CM | POA: Diagnosis not present

## 2021-08-30 DIAGNOSIS — R799 Abnormal finding of blood chemistry, unspecified: Secondary | ICD-10-CM | POA: Diagnosis not present

## 2021-08-30 DIAGNOSIS — E782 Mixed hyperlipidemia: Secondary | ICD-10-CM | POA: Diagnosis not present

## 2021-10-06 IMAGING — CT CT SHOULDER*R* W/O CM
1 series · 12 of 14 positions shown, 15 images · non-contrast
Comparison: No prior comparison imaging is available.

CLINICAL DATA: Right shoulder pain after fall 01/02/2019. Reported
right humerus fracture.

EXAM:
CT OF THE UPPER RIGHT EXTREMITY WITHOUT CONTRAST
TECHNIQUE: Multidetector CT imaging of the upper right extremity was performed
according to the standard protocol.

[Series 4: soft tissue · axial · 0.49mm/px · z∈[-215,-49]mm · 12 of 99 slices shown, 15 images]
[im 8/99  soft-tissue]
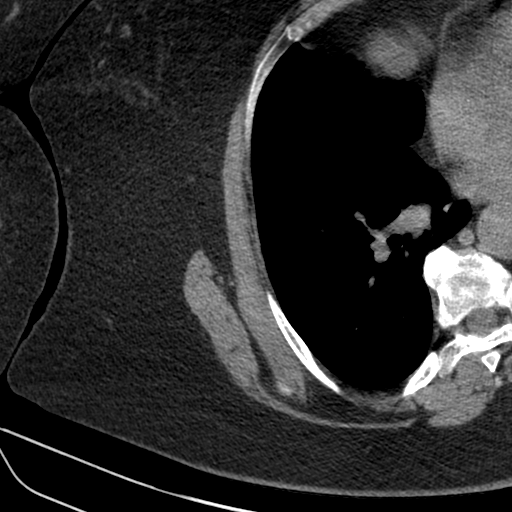
[im 8/99  bone]
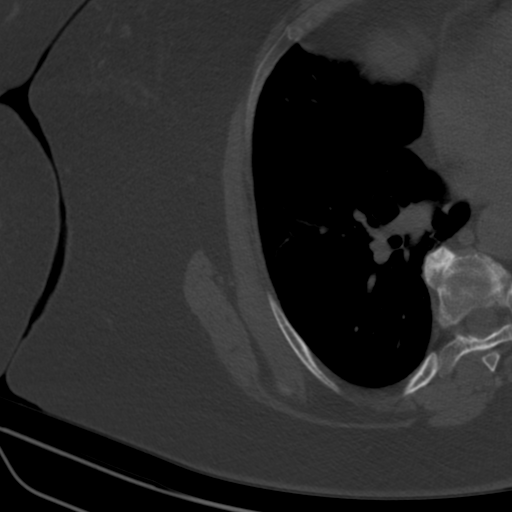
[im 16/99  bone]
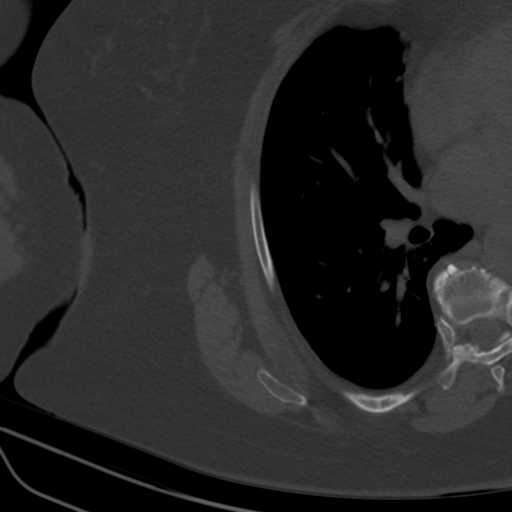
[im 23/99  bone]
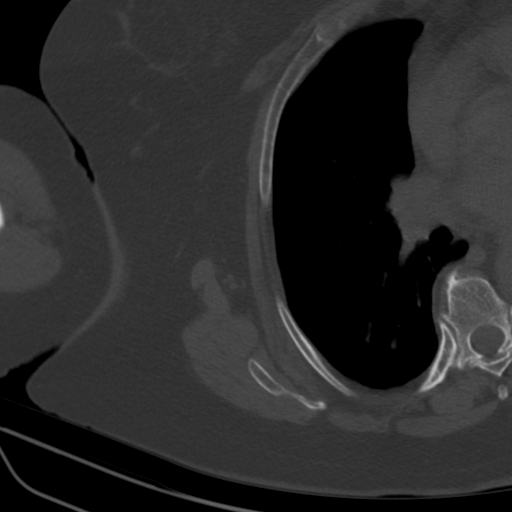
[im 31/99  bone]
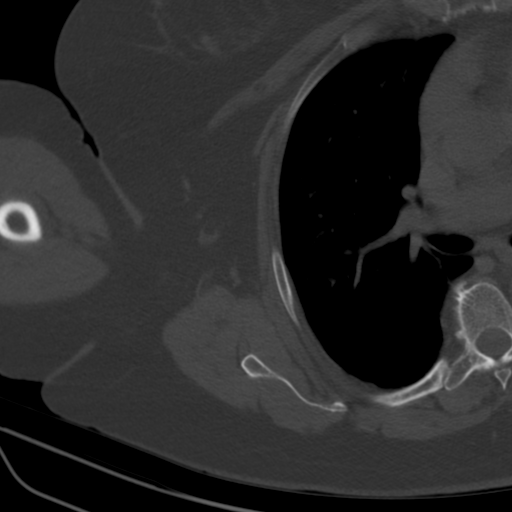
[im 38/99  soft-tissue]
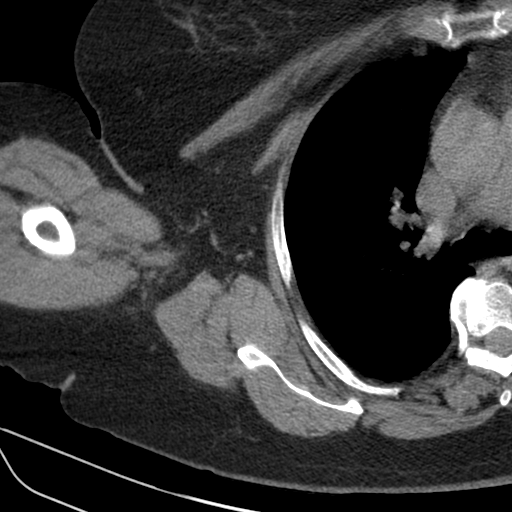
[im 38/99  bone]
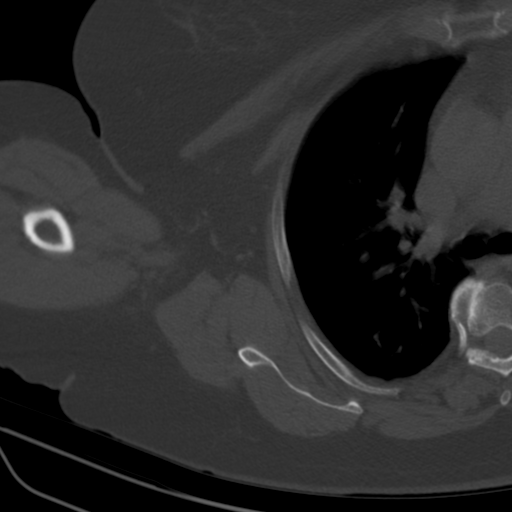
[im 46/99  bone]
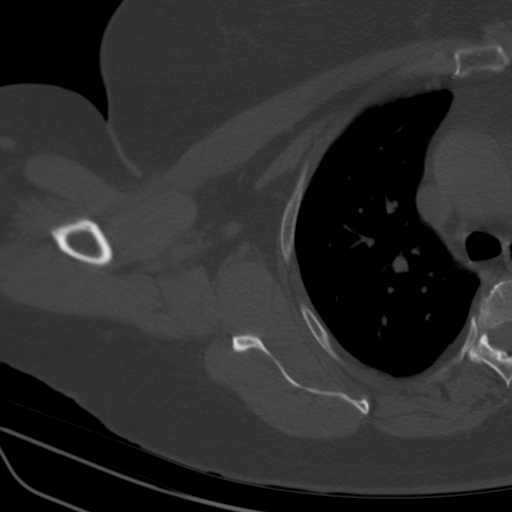
[im 53/99  bone]
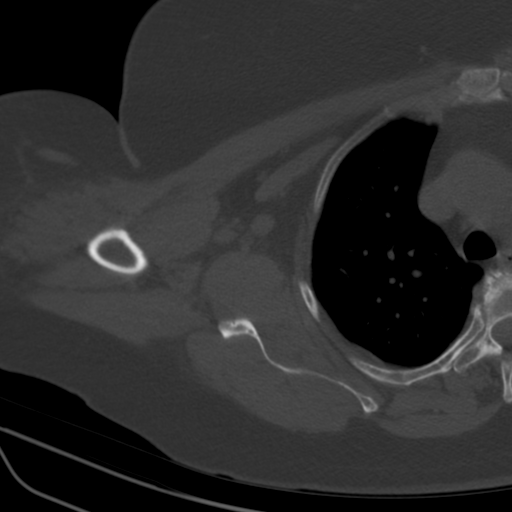
[im 61/99  bone]
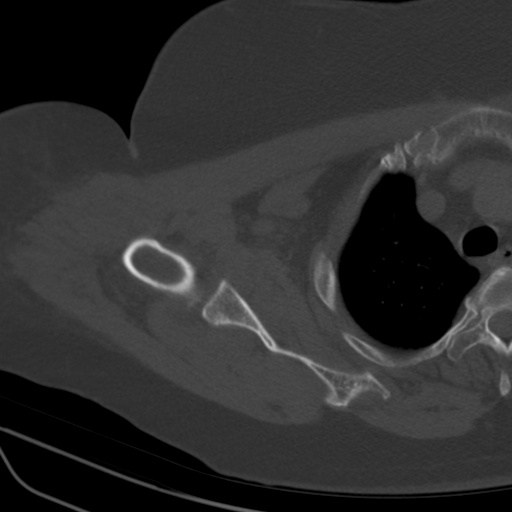
[im 68/99  soft-tissue]
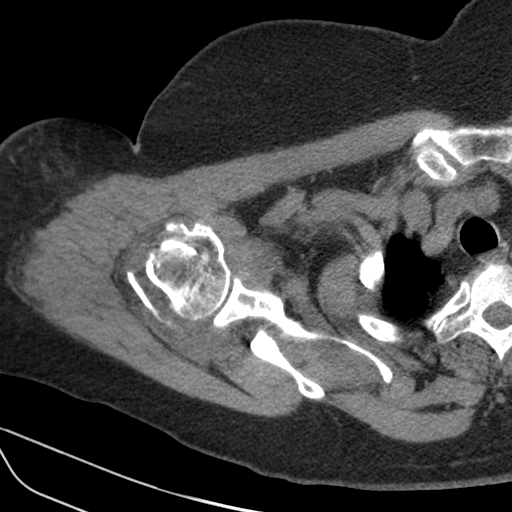
[im 68/99  bone]
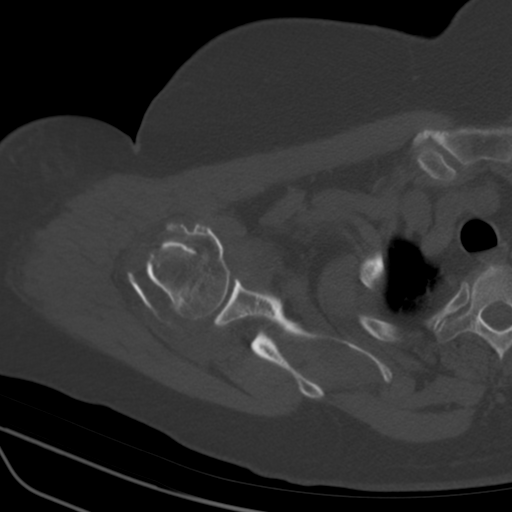
[im 76/99  bone]
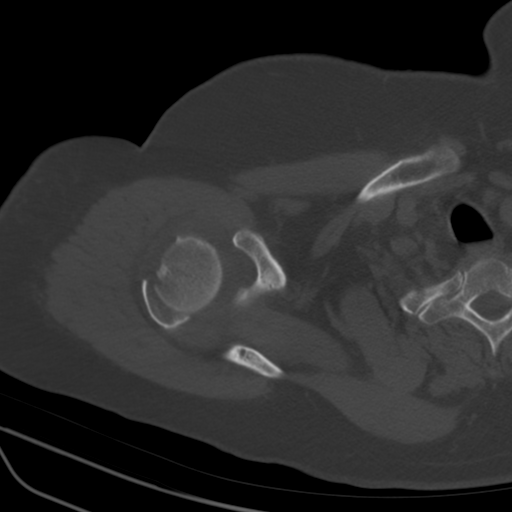
[im 83/99  bone]
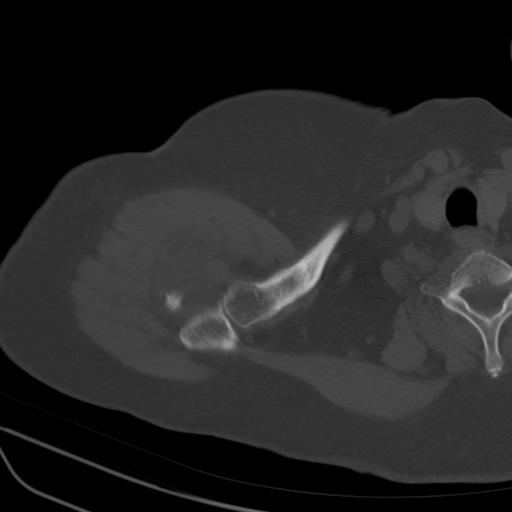
[im 91/99  bone]
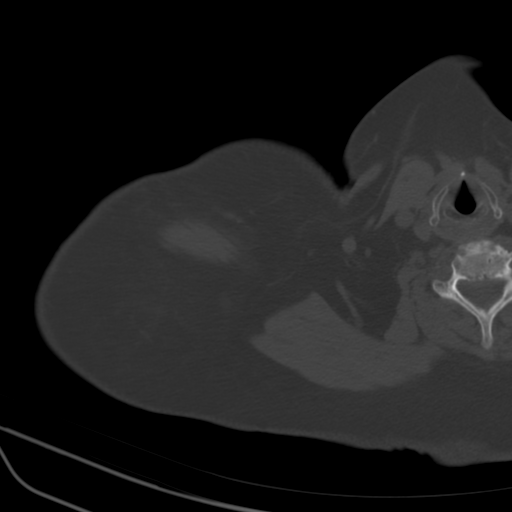

[12 of 14 positions shown; findings below may reference images not displayed]

FINDINGS: Bones/Joint/Cartilage

Comminuted fracture of the proximal right humerus. Transversely
oriented fracture component at the surgical neck with moderate
impaction. Large greater tuberosity fracture component is displaced
superolaterally by approximately 12 mm. Fracture involves the lesser
tuberosity without significant displacement. No definite involvement
of the humeral head articular surface. Small to moderate
glenohumeral lipohemarthrosis. Glenohumeral joint is aligned without
dislocation.

Acromioclavicular joint is intact with minimal hypertrophic
degenerative change. Bony glenoid is intact without fracture.
Remaining visualized osseous structures are intact.

Ligaments

Suboptimally assessed by CT.

Muscles and Tendons

Preserved bulk of the rotator cuff musculature without atrophy or
fatty infiltration. No large intramuscular hematoma. Supraspinatus
tendon appears to insert on the displaced greater tuberosity
fracture fragment. No definite large tendinous abnormality by CT.

Soft tissues

Induration within the subcutaneous fat overlying the anterolateral
aspect of the proximal humerus, likely posttraumatic. Remaining
visualized soft tissues within normal limits. Incidental note of 3
mm right lower lobe pulmonary nodule wall (series 4, image 95).
Visualized portion of the right lung field is otherwise clear.
IMPRESSION: 1. Comminuted, impacted proximal right humerus fracture involving
the surgical neck, greater tuberosity, and lesser tuberosity.
2. Small to moderate glenohumeral lipohemarthrosis.
3. Incidental 3 mm right lower lobe pulmonary nodule. In a low risk
patient, no further follow-up is necessary. In a high-risk patient,
optional 12 month follow-up may be obtained.

## 2021-10-20 IMAGING — RF DG C-ARM 1-60 MIN-NO REPORT
1 series · 2 of 2 positions shown · non-contrast
Comparison: CT right shoulder dated January 08, 2019.

CLINICAL DATA: Proximal humerus ORIF.

EXAM:
RIGHT HUMERUS - 2+ VIEW; DG C-ARM 1-60 MIN-NO REPORT

[Series 1: unknown protocol · 0.20mm/px · 2 of 2 slices shown]
[im 1/2]
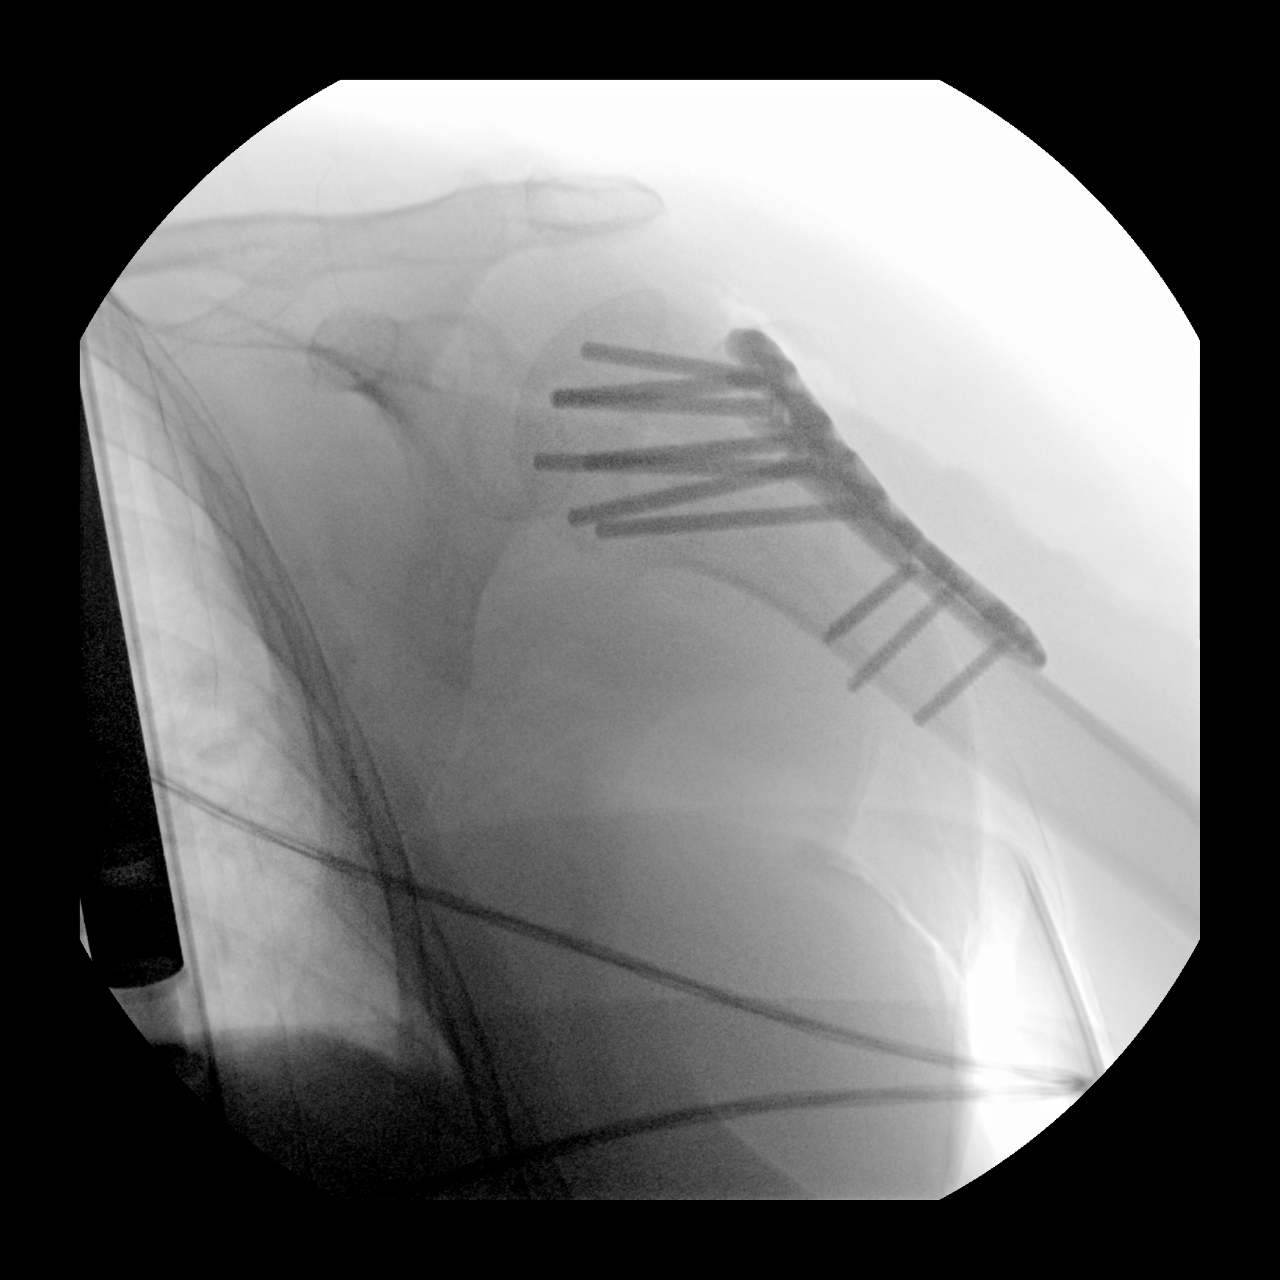
[im 2/2]
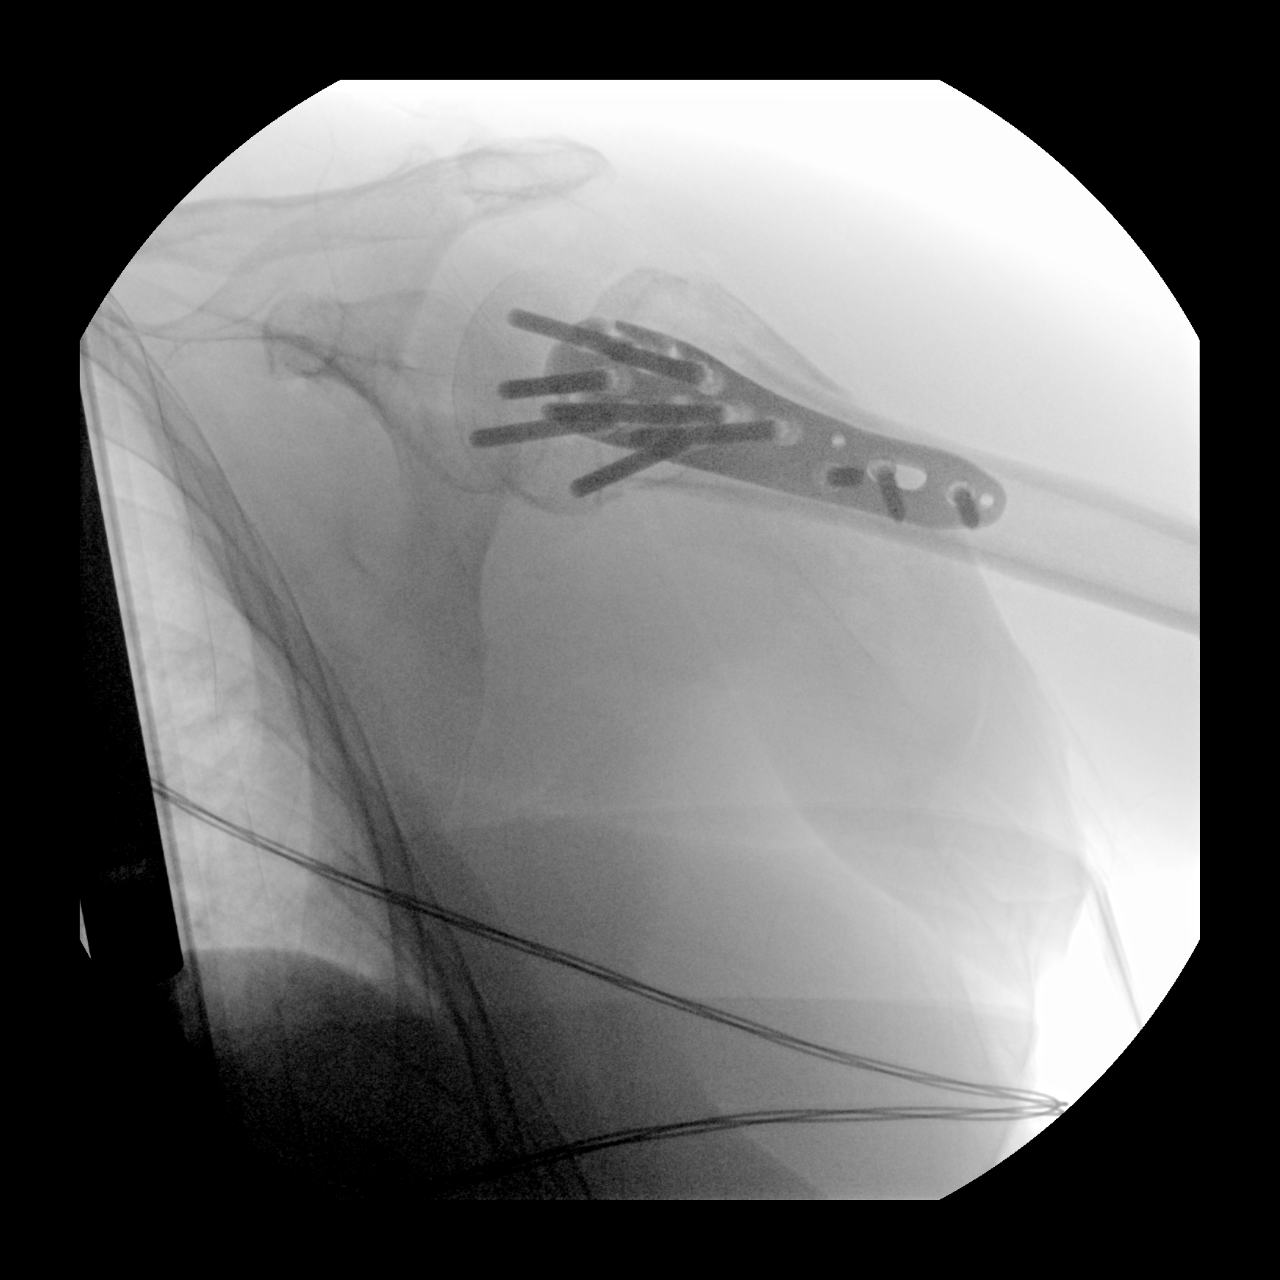

[2 of 2 positions shown; findings below may reference images not displayed]

FINDINGS: Intraoperative fluoroscopic images demonstrate interval plate and
screw fixation of the right humeral head and neck fracture with
improved alignment.
IMPRESSION: Intraoperative fluoroscopic guidance for right proximal humerus
ORIF.

## 2021-10-29 DIAGNOSIS — Z13220 Encounter for screening for lipoid disorders: Secondary | ICD-10-CM | POA: Diagnosis not present

## 2021-10-29 DIAGNOSIS — F411 Generalized anxiety disorder: Secondary | ICD-10-CM | POA: Diagnosis not present

## 2021-10-29 DIAGNOSIS — Z79899 Other long term (current) drug therapy: Secondary | ICD-10-CM | POA: Diagnosis not present

## 2021-10-29 DIAGNOSIS — Z0000 Encounter for general adult medical examination without abnormal findings: Secondary | ICD-10-CM | POA: Diagnosis not present

## 2021-10-29 DIAGNOSIS — E782 Mixed hyperlipidemia: Secondary | ICD-10-CM | POA: Diagnosis not present

## 2021-10-29 DIAGNOSIS — E559 Vitamin D deficiency, unspecified: Secondary | ICD-10-CM | POA: Diagnosis not present

## 2021-12-10 DIAGNOSIS — Z1231 Encounter for screening mammogram for malignant neoplasm of breast: Secondary | ICD-10-CM | POA: Diagnosis not present

## 2021-12-10 DIAGNOSIS — Z780 Asymptomatic menopausal state: Secondary | ICD-10-CM | POA: Diagnosis not present

## 2022-01-24 DIAGNOSIS — R7309 Other abnormal glucose: Secondary | ICD-10-CM | POA: Diagnosis not present

## 2022-01-24 DIAGNOSIS — E669 Obesity, unspecified: Secondary | ICD-10-CM | POA: Diagnosis not present

## 2022-01-25 DIAGNOSIS — Z23 Encounter for immunization: Secondary | ICD-10-CM | POA: Diagnosis not present

## 2022-01-25 DIAGNOSIS — S91301A Unspecified open wound, right foot, initial encounter: Secondary | ICD-10-CM | POA: Diagnosis not present

## 2022-05-02 DIAGNOSIS — E782 Mixed hyperlipidemia: Secondary | ICD-10-CM | POA: Diagnosis not present

## 2022-05-02 DIAGNOSIS — R7401 Elevation of levels of liver transaminase levels: Secondary | ICD-10-CM | POA: Diagnosis not present

## 2022-07-25 ENCOUNTER — Ambulatory Visit (INDEPENDENT_AMBULATORY_CARE_PROVIDER_SITE_OTHER): Payer: Medicare Other | Admitting: Bariatrics

## 2022-07-25 ENCOUNTER — Encounter: Payer: Self-pay | Admitting: Bariatrics

## 2022-07-25 VITALS — BP 126/77 | HR 84 | Temp 97.7°F | Ht <= 58 in | Wt 225.0 lb

## 2022-07-25 DIAGNOSIS — E7849 Other hyperlipidemia: Secondary | ICD-10-CM

## 2022-07-25 DIAGNOSIS — E65 Localized adiposity: Secondary | ICD-10-CM

## 2022-07-25 DIAGNOSIS — Z6841 Body Mass Index (BMI) 40.0 and over, adult: Secondary | ICD-10-CM

## 2022-07-25 DIAGNOSIS — Z0289 Encounter for other administrative examinations: Secondary | ICD-10-CM

## 2022-07-25 NOTE — Progress Notes (Signed)
Office: [phone]  /  Fax: [phone]   Initial Visit  Bethany Griffin was seen in clinic today to evaluate for obesity. She is interested in losing weight to improve overall health and reduce the risk of weight related complications. She presents today to review program treatment options, initial physical assessment, and evaluation.     She was referred by: Self-Referral  When asked what else they would like to accomplish? She states: Adopt healthier eating patterns, Improve energy levels and physical activity, Improve existing medical conditions, Reduce number of medications, Improve quality of life, and Improve appearance  When asked how has your weight affected you? She states: Other: Needs variety in her meals.   Some associated conditions: Hyperlipidemia, Fatty liver disease, and Vitamin D Deficiency  Contributing factors: Family history and Menopause  Weight promoting medications identified: None  Current nutrition plan: Low-carb and High-protein  Current level of physical activity: None and Walking  Current or previous pharmacotherapy: Phentermine ( will finish up 37.5 mg 1/2 pill daily ).   Response to medication: Other: some benefit.    Past medical history includes:   Past Medical History:  Diagnosis Date   Anxiety    B12 deficiency    Closed fracture of right proximal humerus 01/22/2019   Depression    Distal radial fracture    Early menopause    Hyperlipidemia    MVP (mitral valve prolapse) 2012   Echo    Osteopenia    PONV (postoperative nausea and vomiting)      Objective:   BP 126/77   Pulse 84   Temp 97.7 F (36.5 C)   Ht 4\' 10"  (1.473 m)   Wt 225 lb (102.1 kg)   SpO2 97%   BMI 47.03 kg/m  She was weighed on the bioimpedance scale: Body mass index is 47.03 kg/m.  Peak Weight:275 lbs , Body Fat%:54.6 %, Visceral Fat Rating:20, Weight trend over the last 12 months: Decreasing  General:  Alert, oriented and cooperative. Patient is in no  acute distress.  Respiratory: Normal respiratory effort, no problems with respiration noted  Extremities: Normal range of motion.    Mental Status: Normal mood and affect. Normal behavior. Normal judgment and thought content.   DIAGNOSTIC DATA REVIEWED:  BMET    Component Value Date/Time   NA 138 01/22/2019 1235   K 4.2 01/22/2019 1235   CL 105 01/22/2019 1235   CO2 24 01/22/2019 1235   GLUCOSE 102 (H) 01/22/2019 1235   BUN 9 01/22/2019 1235   CREATININE 0.68 01/22/2019 1235   CALCIUM 9.0 01/22/2019 1235   GFRNONAA >60 01/22/2019 1235   GFRAA >60 01/22/2019 1235   Lab Results  Component Value Date   HGBA1C 5.6 01/22/2019   No results found for: "INSULIN" CBC    Component Value Date/Time   WBC 9.9 01/22/2019 1235   RBC 4.56 01/22/2019 1235   HGB 13.0 01/22/2019 1235   HCT 41.2 01/22/2019 1235   PLT 248 01/22/2019 1235   MCV 90.4 01/22/2019 1235   MCH 28.5 01/22/2019 1235   MCHC 31.6 01/22/2019 1235   RDW 13.4 01/22/2019 1235   Iron/TIBC/Ferritin/ %Sat No results found for: "IRON", "TIBC", "FERRITIN", "IRONPCTSAT" Lipid Panel     Component Value Date/Time   CHOL 241 (H) 12/16/2009 2015   TRIG 67 12/16/2009 2015   HDL 53 12/16/2009 2015   CHOLHDL 4.5 Ratio 12/16/2009 2015   VLDL 13 12/16/2009 2015   LDLCALC 175 (H) 12/16/2009 2015   LDLDIRECT 87  03/12/2010 2311   Hepatic Function Panel     Component Value Date/Time   PROT 7.1 01/22/2019 1235   ALBUMIN 4.0 01/22/2019 1235   AST 15 01/22/2019 1235   ALT 19 01/22/2019 1235   ALKPHOS 150 (H) 01/22/2019 1235   BILITOT 0.5 01/22/2019 1235      Component Value Date/Time   TSH 2.693 12/16/2009 2015     Assessment and Plan:   1.Visceral obesity  Her visceral fat rate is 20.   Plan: less than or equal to 13.   2. Hyperlipidemia:   She is taking Pravachol.   Plan: Continue medication.   3. Fatty liver:   Found on [place] and had elevated liver enzymes.   Plan: Will begin plan and exercise.   Morbid  obesity (HCC)  Morbid obesity with body mass index (BMI) of 45.0 to 49.9 in adult [facility])   Obesity Treatment / Action Plan:  Patient will work on garnering support from family and friends to begin weight loss journey. Will work on eliminating or reducing the presence of highly palatable, calorie dense foods in the home. Will complete provided nutritional and psychosocial assessment questionnaire before the next appointment. Will be scheduled for indirect calorimetry to determine resting energy expenditure in a fasting state.  This will allow [place] to create a reduced calorie, high-protein meal plan to promote loss of fat mass while preserving muscle mass.  Obesity Education Performed Today:  She was weighed on the bioimpedance scale and results were discussed and documented in the synopsis.  We discussed obesity as a disease and the importance of a more detailed evaluation of all the factors contributing to the disease.  We discussed the importance of long term lifestyle changes which include nutrition, exercise and behavioral modifications as well as the importance of customizing this to her specific health and social needs.  We discussed the benefits of reaching a healthier weight to alleviate the symptoms of existing conditions and reduce the risks of the biomechanical, metabolic and psychological effects of obesity.  [name] appears to be in the action stage of change and states they are ready to start intensive lifestyle modifications and behavioral modifications.  30 minutes was spent today on this visit including the above counseling, pre-visit chart review, and post-visit documentation.  Discussed New Patient/Late Arrival, and Cancellation Policies. Patient voiced understanding and allowed to ask questions.   Reviewed by clinician on day of visit: allergies, medications, problem list, medical history, surgical history, family history, social history, and previous encounter  notes.    [name]. [name].

## 2022-08-08 DIAGNOSIS — K080 Exfoliation of teeth due to systemic causes: Secondary | ICD-10-CM | POA: Diagnosis not present

## 2022-08-15 DIAGNOSIS — H25811 Combined forms of age-related cataract, right eye: Secondary | ICD-10-CM | POA: Diagnosis not present

## 2022-08-17 ENCOUNTER — Encounter: Payer: Self-pay | Admitting: Bariatrics

## 2022-08-17 ENCOUNTER — Ambulatory Visit (INDEPENDENT_AMBULATORY_CARE_PROVIDER_SITE_OTHER): Payer: Medicare Other | Admitting: Bariatrics

## 2022-08-17 VITALS — BP 131/80 | HR 63 | Temp 97.5°F | Ht <= 58 in | Wt 226.0 lb

## 2022-08-17 DIAGNOSIS — G4709 Other insomnia: Secondary | ICD-10-CM

## 2022-08-17 DIAGNOSIS — E7849 Other hyperlipidemia: Secondary | ICD-10-CM | POA: Diagnosis not present

## 2022-08-17 DIAGNOSIS — Z0000 Encounter for general adult medical examination without abnormal findings: Secondary | ICD-10-CM | POA: Diagnosis not present

## 2022-08-17 DIAGNOSIS — E559 Vitamin D deficiency, unspecified: Secondary | ICD-10-CM

## 2022-08-17 DIAGNOSIS — R5383 Other fatigue: Secondary | ICD-10-CM

## 2022-08-17 DIAGNOSIS — E65 Localized adiposity: Secondary | ICD-10-CM

## 2022-08-17 DIAGNOSIS — F32A Depression, unspecified: Secondary | ICD-10-CM | POA: Diagnosis not present

## 2022-08-17 DIAGNOSIS — Z6841 Body Mass Index (BMI) 40.0 and over, adult: Secondary | ICD-10-CM

## 2022-08-17 DIAGNOSIS — K760 Fatty (change of) liver, not elsewhere classified: Secondary | ICD-10-CM

## 2022-08-17 DIAGNOSIS — R0602 Shortness of breath: Secondary | ICD-10-CM | POA: Diagnosis not present

## 2022-08-17 DIAGNOSIS — Z1331 Encounter for screening for depression: Secondary | ICD-10-CM

## 2022-08-18 LAB — COMPREHENSIVE METABOLIC PANEL
ALT: 22 IU/L (ref 0–32)
AST: 14 IU/L (ref 0–40)
Albumin/Globulin Ratio: 1.7 (ref 1.2–2.2)
Albumin: 4.2 g/dL (ref 3.9–4.9)
Alkaline Phosphatase: 100 IU/L (ref 44–121)
BUN/Creatinine Ratio: 25 (ref 12–28)
BUN: 19 mg/dL (ref 8–27)
Bilirubin Total: <0.2 mg/dL (ref 0.0–1.2)
CO2: 20 mmol/L (ref 20–29)
Calcium: 9.1 mg/dL (ref 8.7–10.3)
Chloride: 103 mmol/L (ref 96–106)
Creatinine, Ser: 0.75 mg/dL (ref 0.57–1.00)
Globulin, Total: 2.5 g/dL (ref 1.5–4.5)
Glucose: 82 mg/dL (ref 70–99)
Potassium: 4.4 mmol/L (ref 3.5–5.2)
Sodium: 139 mmol/L (ref 134–144)
Total Protein: 6.7 g/dL (ref 6.0–8.5)
eGFR: 89 mL/min/{1.73_m2} (ref >59)

## 2022-08-18 LAB — CBC WITH DIFFERENTIAL/PLATELET
Basophils Absolute: 0.1 10*3/uL (ref 0.0–0.2)
Basos: 1 %
EOS (ABSOLUTE): 0.5 10*3/uL — ABNORMAL HIGH (ref 0.0–0.4)
Eos: 5 %
Hematocrit: 40 % (ref 34.0–46.6)
Hemoglobin: 13.2 g/dL (ref 11.1–15.9)
Immature Grans (Abs): 0 10*3/uL (ref 0.0–0.1)
Immature Granulocytes: 0 %
Lymphocytes Absolute: 1.9 10*3/uL (ref 0.7–3.1)
Lymphs: 20 %
MCH: 28.3 pg (ref 26.6–33.0)
MCHC: 33 g/dL (ref 31.5–35.7)
MCV: 86 fL (ref 79–97)
Monocytes Absolute: 0.5 10*3/uL (ref 0.1–0.9)
Monocytes: 6 %
Neutrophils Absolute: 6.6 10*3/uL (ref 1.4–7.0)
Neutrophils: 68 %
Platelets: 212 10*3/uL (ref 150–450)
RBC: 4.66 x10E6/uL (ref 3.77–5.28)
RDW: 14.5 % (ref 11.7–15.4)
WBC: 9.6 10*3/uL (ref 3.4–10.8)

## 2022-08-18 LAB — LIPID PANEL WITH LDL/HDL RATIO
Cholesterol, Total: 220 mg/dL — ABNORMAL HIGH (ref 100–199)
HDL: 63 mg/dL (ref >39)
LDL Chol Calc (NIH): 143 mg/dL — ABNORMAL HIGH (ref 0–99)
LDL/HDL Ratio: 2.3 ratio (ref 0.0–3.2)
Triglycerides: 78 mg/dL (ref 0–149)
VLDL Cholesterol Cal: 14 mg/dL (ref 5–40)

## 2022-08-18 LAB — HEMOGLOBIN A1C
Est. average glucose Bld gHb Est-mCnc: 111 mg/dL
Hgb A1c MFr Bld: 5.5 % (ref 4.8–5.6)

## 2022-08-18 LAB — TSH+T4F+T3FREE
Free T4: 1.03 ng/dL (ref 0.82–1.77)
T3, Free: 2.7 pg/mL (ref 2.0–4.4)
TSH: 2.61 u[IU]/mL (ref 0.450–4.500)

## 2022-08-18 LAB — INSULIN, RANDOM: INSULIN: 13.7 u[IU]/mL (ref 2.6–24.9)

## 2022-08-18 LAB — VITAMIN B12: Vitamin B-12: 1280 pg/mL — ABNORMAL HIGH (ref 232–1245)

## 2022-08-18 LAB — VITAMIN D 25 HYDROXY (VIT D DEFICIENCY, FRACTURES): Vit D, 25-Hydroxy: 57.3 ng/mL (ref 30.0–100.0)

## 2022-08-23 ENCOUNTER — Encounter: Payer: Self-pay | Admitting: Bariatrics

## 2022-08-23 NOTE — Progress Notes (Signed)
Chief Complaint:   OBESITY MCKINZIE PRICER (MR# [id number]) is a 62 y.o. female who presents for evaluation and treatment of obesity and related comorbidities. Current BMI is Body mass index is 47.23 kg/m. Latashia has been struggling with her weight for many years and has been unsuccessful in either losing weight, maintaining weight loss, or reaching her healthy weight goal.  Alicja is currently in the action stage of change and ready to dedicate time achieving and maintaining a healthier weight. Quaniya is interested in becoming our patient and working on intensive lifestyle modifications including (but not limited to) diet and exercise for weight loss.  Patient met with me on 07/25/2022 for an information session.  Jalen's habits were reviewed today and are as follows: Her family eats meals together, she thinks her family will eat healthier with her, her desired weight loss is 86 lbs, she started gaining weight after menopause at 62 years old, her heaviest weight ever was 275 pounds, she is a picky eater and doesn't like to eat healthier foods, she snacks frequently in the evenings, she is frequently drinking liquids with calories, and she struggles with emotional eating.  Depression Screen Charlen's Food and Mood (modified PHQ-9) score was 16.  Subjective:   1. Other fatigue Geryl admits to daytime somnolence and denies waking up still tired. Patient has a history of symptoms of daytime fatigue. Caasi generally gets 5 or 6 hours of sleep per night, and states that she has nightime awakenings. Snoring is present. Apneic episodes are not present. Epworth Sleepiness Score is 2.   2. SOB (shortness of breath) on exertion Idalee notes increasing shortness of breath with exercising and seems to be worsening over time with weight gain. She notes getting out of breath sooner with activity than she used to. This has not gotten worse recently. Seher denies shortness of breath at rest  or orthopnea.  3. Visceral obesity Patient's visceral fat rating is 20.  4. Other hyperlipidemia Patient is taking Pravachol.  5. Vitamin D deficiency Patient is taking vitamin D.  6. Other insomnia Patient notes her sleep varies.  7. Fatty liver Patient's diagnosis was confirmed on ultrasound in 2023.  Elevated liver enzymes last year.  8. Health care maintenance Given obesity.   Assessment/Plan:   1. Other fatigue Zarianna does feel that her weight is causing her energy to be lower than it should be. Fatigue may be related to obesity, depression or many other causes. Labs will be ordered, and in the meanwhile, Lisl will focus on self care including making healthy food choices, increasing physical activity and focusing on stress reduction.  - EKG 12-Lead - TSH+T4F+T3Free - Vitamin B12  2. SOB (shortness of breath) on exertion Marvalene does feel that she gets out of breath more easily that she used to when she exercises. Lamona's shortness of breath appears to be obesity related and exercise induced. She has agreed to work on weight loss and gradually increase exercise to treat her exercise induced shortness of breath. Will continue to monitor closely.  3. Visceral obesity We will check labs today.  Goal visceral fat rating is 13 or below.  - Hemoglobin A1c - Insulin, random - Lipid Panel With LDL/HDL Ratio - TSH+T4F+T3Free - VITAMIN D 25 Hydroxy (Vit-D Deficiency, Fractures) - Comprehensive metabolic panel - CBC with Differential/Platelet - Vitamin B12  4. Other hyperlipidemia We will check labs today.  Patient will continue her medications as directed.  - Lipid Panel With LDL/HDL Ratio  5. Vitamin D deficiency We will check labs today.  Patient will continue vitamin D as directed.  - VITAMIN D 25 Hydroxy (Vit-D Deficiency, Fractures)  6. Other insomnia Patient is to journal for 2 weeks.  7. Fatty liver We will check labs today, we will follow-up at her  next visit.  8. Health care maintenance We will check labs today.  EKG and IC were done today and reviewed with the patient.  - Hemoglobin A1c - Insulin, random - Lipid Panel With LDL/HDL Ratio - TSH+T4F+T3Free - VITAMIN D 25 Hydroxy (Vit-D Deficiency, Fractures) - Comprehensive metabolic panel - CBC with Differential/Platelet - Vitamin B12  9. Depression screening Jada had a positive depression screening. Depression is commonly associated with obesity and often results in emotional eating behaviors. We will monitor this closely and work on CBT to help improve the non-hunger eating patterns. Referral to Psychology may be required if no improvement is seen as she continues in our clinic.  10. Morbid obesity (HCC)  11. BMI 45.0-49.9, adult [facility]) Inesha is currently in the action stage of change and her goal is to continue with weight loss efforts. I recommend Nola begin the structured treatment plan as follows:  She has agreed to the Category 2 Plan and keeping a food journal and adhering to recommended goals of 1200 calories and 80-90 grams of protein.  Meal planning and intentional eating were discussed.  Fruit handout was given.  Exercise goals: No exercise has been prescribed at this time.   Behavioral modification strategies: increasing lean protein intake, decreasing simple carbohydrates, increasing vegetables, increasing water intake, decreasing eating out, no skipping meals, meal planning and cooking strategies, and keeping healthy foods in the home.  She was informed of the importance of frequent follow-up visits to maximize her success with intensive lifestyle modifications for her multiple health conditions. She was informed we would discuss her lab results at her next visit unless there is a critical issue that needs to be addressed sooner. Neyah agreed to keep her next visit at the agreed upon time to discuss these results.  Objective:   Blood pressure 131/80,  pulse 63, temperature (!) 97.5 F (36.4 C), height 4\' 10"  (1.473 m), weight 226 lb (102.5 kg), SpO2 98 %. Body mass index is 47.23 kg/m.  EKG: Normal sinus rhythm, rate 69 BPM.  Indirect Calorimeter completed today shows a VO2 of 228 and a REE of 1570.  Her calculated basal metabolic rate is 2952 thus her basal metabolic rate is better than expected.  General: Cooperative, alert, well developed, in no acute distress. HEENT: Conjunctivae and lids unremarkable. Cardiovascular: Regular rhythm.  Lungs: Normal work of breathing. Neurologic: No focal deficits.   Lab Results  Component Value Date   CREATININE 0.75 08/17/2022   BUN 19 08/17/2022   NA 139 08/17/2022   K 4.4 08/17/2022   CL 103 08/17/2022   CO2 20 08/17/2022   Lab Results  Component Value Date   ALT 22 08/17/2022   AST 14 08/17/2022   ALKPHOS 100 08/17/2022   BILITOT <0.2 08/17/2022   Lab Results  Component Value Date   HGBA1C 5.5 08/17/2022   HGBA1C 5.6 01/22/2019   Lab Results  Component Value Date   INSULIN 13.7 08/17/2022   Lab Results  Component Value Date   TSH 2.610 08/17/2022   Lab Results  Component Value Date   CHOL 220 (H) 08/17/2022   HDL 63 08/17/2022   LDLCALC 143 (H) 08/17/2022   LDLDIRECT 87 03/12/2010  TRIG 78 08/17/2022   CHOLHDL 4.5 Ratio 12/16/2009   Lab Results  Component Value Date   WBC 9.6 08/17/2022   HGB 13.2 08/17/2022   HCT 40.0 08/17/2022   MCV 86 08/17/2022   PLT 212 08/17/2022   No results found for: "IRON", "TIBC", "FERRITIN"  Attestation Statements:   Reviewed by clinician on day of visit: allergies, medications, problem list, medical history, surgical history, family history, social history, and previous encounter notes.   Trude Mcburney, am acting as Energy manager for [employer], DO.  I have reviewed the above documentation for accuracy and completeness, and I agree with the above. Corinna Capra, DO

## 2022-08-31 ENCOUNTER — Ambulatory Visit (INDEPENDENT_AMBULATORY_CARE_PROVIDER_SITE_OTHER): Payer: Medicare Other | Admitting: Bariatrics

## 2022-08-31 ENCOUNTER — Encounter: Payer: Self-pay | Admitting: Bariatrics

## 2022-08-31 VITALS — BP 132/81 | HR 63 | Temp 97.9°F | Ht <= 58 in | Wt 220.0 lb

## 2022-08-31 DIAGNOSIS — Z6841 Body Mass Index (BMI) 40.0 and over, adult: Secondary | ICD-10-CM | POA: Insufficient documentation

## 2022-08-31 DIAGNOSIS — E669 Obesity, unspecified: Secondary | ICD-10-CM

## 2022-08-31 DIAGNOSIS — E7800 Pure hypercholesterolemia, unspecified: Secondary | ICD-10-CM | POA: Diagnosis not present

## 2022-08-31 DIAGNOSIS — E88819 Insulin resistance, unspecified: Secondary | ICD-10-CM | POA: Diagnosis not present

## 2022-09-05 NOTE — Progress Notes (Unsigned)
Chief Complaint:   OBESITY Bethany Griffin is here to discuss her progress with her obesity treatment plan along with follow-up of her obesity related diagnoses. Bethany Griffin is on the Category 2 Plan and keeping a food journal and adhering to recommended goals of 1200 calories and 80-90 grams of protein and states she is following her eating plan approximately 80% of the time. Bethany Griffin states she is doing 0 minutes 0 times per week.  Today's visit was #: 2 Starting weight: 226 lbs Starting date: 08/17/2022 Today's weight: 220 lbs Today's date: 08/31/2022 Total lbs lost to date: 6 Total lbs lost since last in-office visit: 6  Interim History: Patient is down 6 pounds since her first visit.  She wanted to snack more at times.  Subjective:   1. Insulin resistance His last insulin level was 13.7.  2. Elevated cholesterol Patient is taking pravastatin.  Total cholesterol was 220, LDL 143, and HDL 63.  Assessment/Plan:   1. Insulin resistance Patient's insulin goal is to be below 10.  2. Elevated cholesterol Patient will continue pravastatin as directed.  3. Morbid obesity (HCC)  4. BMI 45.0-49.9, adult Bethany Griffin is currently in the action stage of change. As such, her goal is to continue with weight loss efforts. She has agreed to the Category 2 Plan.   Review labs with the patient from 08/17/2022, CMP, lipids, vitamin D, B12, A1c, insulin, and thyroid panel.  Patient is to increase her water intake and keep her protein intake high.  Dining out handout was given.  Exercise goals: No exercise has been prescribed at this time.  Behavioral modification strategies: increasing lean protein intake, decreasing simple carbohydrates, increasing vegetables, increasing water intake, decreasing eating out, no skipping meals, meal planning and cooking strategies, keeping healthy foods in the home, and planning for success.  Bethany Griffin has agreed to follow-up with our clinic in 2 weeks. She was  informed of the importance of frequent follow-up visits to maximize her success with intensive lifestyle modifications for her multiple health conditions.   Objective:   Blood pressure 132/81, pulse 63, temperature 97.9 F (36.6 C), height 4\' 10"  (1.473 m), weight 220 lb (99.8 kg), SpO2 97 %. Body mass index is 45.98 kg/m.  General: Cooperative, alert, well developed, in no acute distress. HEENT: Conjunctivae and lids unremarkable. Cardiovascular: Regular rhythm.  Lungs: Normal work of breathing. Neurologic: No focal deficits.   Lab Results  Component Value Date   CREATININE 0.75 08/17/2022   BUN 19 08/17/2022   NA 139 08/17/2022   K 4.4 08/17/2022   CL 103 08/17/2022   CO2 20 08/17/2022   Lab Results  Component Value Date   ALT 22 08/17/2022   AST 14 08/17/2022   ALKPHOS 100 08/17/2022   BILITOT <0.2 08/17/2022   Lab Results  Component Value Date   HGBA1C 5.5 08/17/2022   HGBA1C 5.6 01/22/2019   Lab Results  Component Value Date   INSULIN 13.7 08/17/2022   Lab Results  Component Value Date   TSH 2.610 08/17/2022   Lab Results  Component Value Date   CHOL 220 (H) 08/17/2022   HDL 63 08/17/2022   LDLCALC 143 (H) 08/17/2022   LDLDIRECT 87 03/12/2010   TRIG 78 08/17/2022   CHOLHDL 4.5 Ratio 12/16/2009   Lab Results  Component Value Date   VD25OH 57.3 08/17/2022   VD25OH 41 12/16/2009   Lab Results  Component Value Date   WBC 9.6 08/17/2022   HGB 13.2 08/17/2022  HCT 40.0 08/17/2022   MCV 86 08/17/2022   PLT 212 08/17/2022   No results found for: "IRON", "TIBC", "FERRITIN"  Attestation Statements:   Reviewed by clinician on day of visit: allergies, medications, problem list, medical history, surgical history, family history, social history, and previous encounter notes.   Trude Mcburney, am acting as Energy manager for [employer], DO.  I have reviewed the above documentation for accuracy and completeness, and I agree with the above. Corinna Capra, DO

## 2022-09-12 DIAGNOSIS — D2239 Melanocytic nevi of other parts of face: Secondary | ICD-10-CM | POA: Diagnosis not present

## 2022-09-12 DIAGNOSIS — D485 Neoplasm of uncertain behavior of skin: Secondary | ICD-10-CM | POA: Diagnosis not present

## 2022-09-12 DIAGNOSIS — L821 Other seborrheic keratosis: Secondary | ICD-10-CM | POA: Diagnosis not present

## 2022-09-14 DIAGNOSIS — K080 Exfoliation of teeth due to systemic causes: Secondary | ICD-10-CM | POA: Diagnosis not present

## 2022-09-20 ENCOUNTER — Ambulatory Visit (INDEPENDENT_AMBULATORY_CARE_PROVIDER_SITE_OTHER): Payer: Medicare Other | Admitting: Nurse Practitioner

## 2022-09-20 ENCOUNTER — Encounter: Payer: Self-pay | Admitting: Nurse Practitioner

## 2022-09-20 VITALS — BP 139/78 | HR 72 | Temp 97.9°F | Ht <= 58 in | Wt 225.0 lb

## 2022-09-20 DIAGNOSIS — Z6841 Body Mass Index (BMI) 40.0 and over, adult: Secondary | ICD-10-CM | POA: Diagnosis not present

## 2022-09-20 DIAGNOSIS — E7800 Pure hypercholesterolemia, unspecified: Secondary | ICD-10-CM

## 2022-09-20 NOTE — Progress Notes (Signed)
Office: [phone]  /  Fax: [phone]  WEIGHT SUMMARY AND BIOMETRICS  No data recorded Weight Gained Since Last Visit: 5lb   Vitals Temp: 97.9 F (36.6 C) BP: 139/78 Pulse Rate: 72 SpO2: 97 %   Anthropometric Measurements Height: 4\' 10"  (1.473 m) Weight: 225 lb (102.1 kg) BMI (Calculated): 47.04 Weight at Last Visit: 220lb Weight Gained Since Last Visit: 5lb Starting Weight: 226lb Total Weight Loss (lbs): 1 lb (0.454 kg)   Body Composition  Body Fat %: 53.8 % Fat Mass (lbs): 121.2 lbs Muscle Mass (lbs): 98.8 lbs Visceral Fat Rating : 20   Other Clinical Data Fasting: No Labs: No Today's Visit #: 3 Starting Date: 08/17/22     HPI  Chief Complaint: OBESITY  Bethany Griffin is here to discuss her progress with her obesity treatment plan. She is on the the Category 2 Plan and states she is following her eating plan approximately 50 % of the time. She states she is exercising 0 minutes 0 days per week.   Interval History:  Since last office visit she has gained 5 pounds.  She has had several celebrations since her last visit and has gotten off track.  She hasn't been exercising as she was in the past.  She struggles with cravings and finds she snacks more. Feels that she is eating to fill void.  She is at home by herself daily. She drinks water/sparkling water daily.  Denies surgery drinks.     Pharmacotherapy for weight loss: She is not currently taking medications  for medical weight loss.   Previous pharmacotherapy for medical weight loss:  Phentermine  Bariatric surgery:  Patient has not had bariatric surgery.    Hyperlipidemia Medication(s): pravastatin 80mg . Denies side effects.  Tried another statin in the past and stopped due to side effects.    Lab Results  Component Value Date   CHOL 220 (H) 08/17/2022   HDL 63 08/17/2022   LDLCALC 143 (H) 08/17/2022   LDLDIRECT 87 03/12/2010   TRIG 78 08/17/2022   CHOLHDL 4.5 Ratio 12/16/2009   Lab Results   Component Value Date   ALT 22 08/17/2022   AST 14 08/17/2022   ALKPHOS 100 08/17/2022   BILITOT <0.2 08/17/2022   The 10-year ASCVD risk score (Arnett DK, et al., 2019) is: 5.2%   Values used to calculate the score:     Age: 62 years     Sex: Female     Is Non-Hispanic African American: No     Diabetic: No     Tobacco smoker: No     Systolic Blood Pressure: 139 mmHg     Is BP treated: No     HDL Cholesterol: 63 mg/dL     Total Cholesterol: 220 mg/dL     PHYSICAL EXAM:  Blood pressure 139/78, pulse 72, temperature 97.9 F (36.6 C), height 4\' 10"  (1.473 m), weight 225 lb (102.1 kg), SpO2 97 %. Body mass index is 47.03 kg/m.  General: She is overweight, cooperative, alert, well developed, and in no acute distress. PSYCH: Has normal mood, affect and thought process.   Extremities: No edema.  Neurologic: No gross sensory or motor deficits. No tremors or fasciculations noted.    DIAGNOSTIC DATA REVIEWED:  BMET    Component Value Date/Time   NA 139 08/17/2022 1008   K 4.4 08/17/2022 1008   CL 103 08/17/2022 1008   CO2 20 08/17/2022 1008   GLUCOSE 82 08/17/2022 1008   GLUCOSE 102 (H) 01/22/2019 1235  BUN 19 08/17/2022 1008   CREATININE 0.75 08/17/2022 1008   CALCIUM 9.1 08/17/2022 1008   GFRNONAA >60 01/22/2019 1235   GFRAA >60 01/22/2019 1235   Lab Results  Component Value Date   HGBA1C 5.5 08/17/2022   HGBA1C 5.6 01/22/2019   Lab Results  Component Value Date   INSULIN 13.7 08/17/2022   Lab Results  Component Value Date   TSH 2.610 08/17/2022   CBC    Component Value Date/Time   WBC 9.6 08/17/2022 1008   WBC 9.9 01/22/2019 1235   RBC 4.66 08/17/2022 1008   RBC 4.56 01/22/2019 1235   HGB 13.2 08/17/2022 1008   HCT 40.0 08/17/2022 1008   PLT 212 08/17/2022 1008   MCV 86 08/17/2022 1008   MCH 28.3 08/17/2022 1008   MCH 28.5 01/22/2019 1235   MCHC 33.0 08/17/2022 1008   MCHC 31.6 01/22/2019 1235   RDW 14.5 08/17/2022 1008   Iron Studies No  results found for: "IRON", "TIBC", "FERRITIN", "IRONPCTSAT" Lipid Panel     Component Value Date/Time   CHOL 220 (H) 08/17/2022 1008   TRIG 78 08/17/2022 1008   HDL 63 08/17/2022 1008   CHOLHDL 4.5 Ratio 12/16/2009 2015   VLDL 13 12/16/2009 2015   LDLCALC 143 (H) 08/17/2022 1008   LDLDIRECT 87 03/12/2010 2311   Hepatic Function Panel     Component Value Date/Time   PROT 6.7 08/17/2022 1008   ALBUMIN 4.2 08/17/2022 1008   AST 14 08/17/2022 1008   ALT 22 08/17/2022 1008   ALKPHOS 100 08/17/2022 1008   BILITOT <0.2 08/17/2022 1008      Component Value Date/Time   TSH 2.610 08/17/2022 1008   Nutritional Lab Results  Component Value Date   VD25OH 57.3 08/17/2022   VD25OH 41 12/16/2009     ASSESSMENT AND PLAN  TREATMENT PLAN FOR OBESITY:  Recommended Dietary Goals  Bethany Griffin is currently in the action stage of change. As such, her goal is to continue weight management plan. She has agreed to the Category 2 Plan.  Behavioral Intervention  We discussed the following Behavioral Modification Strategies today: increasing lean protein intake, decreasing simple carbohydrates , increasing vegetables, increasing lower glycemic fruits, increasing fiber rich foods, avoiding skipping meals, increasing water intake, keeping healthy foods at home, continue to practice mindfulness when eating, and planning for success.  Additional resources provided today: NA  Recommended Physical Activity Goals  Bethany Griffin has been advised to work up to 150 minutes of moderate intensity aerobic activity a week and strengthening exercises 2-3 times per week for cardiovascular health, weight loss maintenance and preservation of muscle mass.   She has agreed to Think about ways to increase daily physical activity and overcoming barriers to exercise, Increase physical activity in their day and reduce sedentary time (increase NEAT)., and Work on scheduling and tracking physical activity.     ASSOCIATED  CONDITIONS ADDRESSED TODAY  Action/Plan  Elevated cholesterol Continue follow-up with PCP.  Continue medications as directed.  To continue working on dietary changes, exercise and weight loss.  ASCVD reviewed with patient today.  Morbid obesity (HCC)  BMI 45.0-49.9, adult (HCC)         Return in about 2 weeks (around 10/04/2022).Marland Kitchen She was informed of the importance of frequent follow up visits to maximize her success with intensive lifestyle modifications for her multiple health conditions.   ATTESTASTION STATEMENTS:  Reviewed by clinician on day of visit: allergies, medications, problem list, medical history, surgical history, family history, social history,  and previous encounter notes.   Time spent on visit including pre-visit chart review and post-visit care and charting was 30 minutes.    Theodis Sato. Alyra Patty FNP-C

## 2022-10-04 ENCOUNTER — Ambulatory Visit: Payer: Medicare Other | Admitting: Bariatrics

## 2022-10-13 ENCOUNTER — Ambulatory Visit (INDEPENDENT_AMBULATORY_CARE_PROVIDER_SITE_OTHER): Payer: Medicare Other | Admitting: Bariatrics

## 2022-10-13 ENCOUNTER — Telehealth (INDEPENDENT_AMBULATORY_CARE_PROVIDER_SITE_OTHER): Payer: Self-pay | Admitting: Bariatrics

## 2022-10-13 ENCOUNTER — Encounter: Payer: Self-pay | Admitting: Bariatrics

## 2022-10-13 VITALS — BP 135/83 | HR 62 | Temp 97.9°F | Ht <= 58 in | Wt 227.0 lb

## 2022-10-13 DIAGNOSIS — Z6841 Body Mass Index (BMI) 40.0 and over, adult: Secondary | ICD-10-CM | POA: Diagnosis not present

## 2022-10-13 DIAGNOSIS — F509 Eating disorder, unspecified: Secondary | ICD-10-CM | POA: Diagnosis not present

## 2022-10-13 DIAGNOSIS — E669 Obesity, unspecified: Secondary | ICD-10-CM

## 2022-10-13 DIAGNOSIS — E88819 Insulin resistance, unspecified: Secondary | ICD-10-CM

## 2022-10-13 MED ORDER — BUPROPION HCL ER (SR) 150 MG PO TB12
150.0000 mg | ORAL_TABLET | Freq: Every day | ORAL | 0 refills | Status: DC
Start: 2022-10-13 — End: 2022-12-01

## 2022-10-13 NOTE — Telephone Encounter (Signed)
Called in reference to the RX for Bupropion SR 150 mg 1 time per day. Pharmacy stated that usually SR is given 2 times a day and XL is given 1 time per day. Would like someone to call and confirm doseage. [phone]   Thank you Vickie

## 2022-10-13 NOTE — Progress Notes (Signed)
WEIGHT SUMMARY AND BIOMETRICS  Weight Gained Since Last Visit: 2lb   Vitals Temp: 97.9 F (36.6 C) BP: 135/83 Pulse Rate: 62 SpO2: 98 %   Anthropometric Measurements Height: 4\' 10"  (1.473 m) Weight: 227 lb (103 kg) BMI (Calculated): 47.46 Weight at Last Visit: 225b Weight Gained Since Last Visit: 2lb Starting Weight: 226lb Total Weight Loss (lbs): 0 lb (0 kg)   Body Composition  Body Fat %: 55.3 % Fat Mass (lbs): 125.8 lbs Muscle Mass (lbs): 96.4 lbs Visceral Fat Rating : 21   Other Clinical Data Fasting: no Labs: no Today's Visit #: 4 Starting Date: 08/17/22    OBESITY [name] is here to discuss her progress with her obesity treatment plan along with follow-up of her obesity related diagnoses.     Nutrition Plan: the Category 2 plan - 30% adherence.  Current exercise:  She goes to the gym   Interim History:  She is up 2 lbs since her last visit.  Protein intake is less than prescribed. and Water intake is adequate.  Pharmacotherapy: Lottie is on Metformin 250 mg BID Adverse side effects: None Hunger is moderately controlled.  Cravings are poorly controlled.  Assessment/Plan:  Insulin Resistance [name] has had elevated fasting insulin readings. Goal is HgbA1c < 5.7, fasting insulin at l0 or less, and preferably at 5.  She reports polyphagia. Medication(s): Metformin as above.  Lab Results  Component Value Date   HGBA1C 5.5 08/17/2022   Lab Results  Component Value Date   INSULIN 13.7 08/17/2022    Plan Medication(s): Metformin 250 mg Bid  Will work on the agreed upon plan. Will minimize refined carbohydrates ( sweets and starches), and focus more on complex carbohydrates.  Increase the micronutrients found in leafy greens, which include magnesium, polyphenols, and vitamin C which have been postulated to help with insulin sensitivity. Minimize "fast food" and cook more meals at home.  Increase fiber to 25 to 30 grams daily.    Eating disorder/emotional eating [name] has had issues with stress eating, nighttime eating, and boredom eating. Currently this is poorly controlled. Overall mood is stable. Denies suicidal/homicidal ideation. Medication(s): none  Plan:  Specifically regarding patient's less desirable eating habits and patterns, we employed the technique of small changes when she cannot fully commit to her prudent nutritional plan. Discussed distractions to curb eating behaviors. Discussed activities to do with one's hands in the evening  Be sure to get adequate rest as lack of rest can trigger appetite.  Have plan in place for stressful events.  Consider other rewards besides food.   Will begin Wellbutrin 150 mg 1 daily # 30 with 0 refills.  Will pick good snacks.  Will not bring "tempting" snacks into the house.    Generalized Obesity: Current BMI BMI (Calculated): 47.46   Pharmacotherapy Plan Continue  Metformin   [name] is currently in the action stage of change. As such, her goal is to continue with weight loss efforts.  She has agreed to the Category 2 plan.  Exercise goals: All adults should avoid inactivity. Some physical activity is better than none, and adults who participate in any amount of physical activity gain some health benefits. Walking on the treadmill.   Behavioral modification strategies: increasing lean protein intake, decreasing simple carbohydrates , meal planning , increase water intake, better snacking choices, planning for success, emotional eating strategies, get rid of junk food in the home, and decrease snacking .      Objective:   VITALS: Per patient  if applicable, see vitals. GENERAL: Alert and in no acute distress. CARDIOPULMONARY: No increased WOB. Speaking in clear sentences.  PSYCH: Pleasant and cooperative. Speech normal rate and rhythm. Affect is appropriate. Insight and judgement are appropriate. Attention is focused, linear, and appropriate.  NEURO:  Oriented as arrived to appointment on time with no prompting.   Attestation Statements:    This was prepared with the assistance of Engineer, civil (consulting).  Occasional wrong-word or sound-a-like substitutions may have occurred due to the inherent limitations of voice recognition software.

## 2022-10-27 DIAGNOSIS — K080 Exfoliation of teeth due to systemic causes: Secondary | ICD-10-CM | POA: Diagnosis not present

## 2022-11-07 ENCOUNTER — Ambulatory Visit: Payer: Medicare Other | Admitting: Bariatrics

## 2022-11-15 DIAGNOSIS — H18413 Arcus senilis, bilateral: Secondary | ICD-10-CM | POA: Diagnosis not present

## 2022-11-15 DIAGNOSIS — H2513 Age-related nuclear cataract, bilateral: Secondary | ICD-10-CM | POA: Diagnosis not present

## 2022-11-15 DIAGNOSIS — H2511 Age-related nuclear cataract, right eye: Secondary | ICD-10-CM | POA: Diagnosis not present

## 2022-11-15 DIAGNOSIS — H25013 Cortical age-related cataract, bilateral: Secondary | ICD-10-CM | POA: Diagnosis not present

## 2022-11-15 DIAGNOSIS — H25043 Posterior subcapsular polar age-related cataract, bilateral: Secondary | ICD-10-CM | POA: Diagnosis not present

## 2022-12-01 ENCOUNTER — Encounter: Payer: Self-pay | Admitting: Bariatrics

## 2022-12-01 ENCOUNTER — Ambulatory Visit (INDEPENDENT_AMBULATORY_CARE_PROVIDER_SITE_OTHER): Payer: Medicare Other | Admitting: Bariatrics

## 2022-12-01 VITALS — BP 123/75 | HR 78 | Temp 97.9°F | Ht <= 58 in | Wt 236.0 lb

## 2022-12-01 DIAGNOSIS — E65 Localized adiposity: Secondary | ICD-10-CM

## 2022-12-01 DIAGNOSIS — F509 Eating disorder, unspecified: Secondary | ICD-10-CM | POA: Diagnosis not present

## 2022-12-01 DIAGNOSIS — Z6841 Body Mass Index (BMI) 40.0 and over, adult: Secondary | ICD-10-CM | POA: Diagnosis not present

## 2022-12-01 MED ORDER — BUPROPION HCL ER (SR) 200 MG PO TB12: 200.0000 mg | ORAL_TABLET | Freq: Every day | ORAL | 0 refills | Status: DC

## 2022-12-01 NOTE — Progress Notes (Signed)
WEIGHT SUMMARY AND BIOMETRICS  Weight Gained Since Last Visit: 9lb   Vitals Temp: 97.9 F (36.6 C) BP: 123/75 Pulse Rate: 78 SpO2: 97 %   Anthropometric Measurements Height: 4\' 10"  (1.473 m) Weight: 236 lb (107 kg) BMI (Calculated): 49.34 Weight at Last Visit: 227lb Weight Gained Since Last Visit: 9lb Starting Weight: 226lb Total Weight Loss (lbs): 0 lb (0 kg)   Body Composition  Body Fat %: 55.3 % Fat Mass (lbs): 130.6 lbs Muscle Mass (lbs): 100 lbs Visceral Fat Rating : 21   Other Clinical Data Fasting: no Labs: no Today's Visit #: 5 Starting Date: 08/17/22   OBESITY Lynise is here to discuss her progress with her obesity treatment plan along with follow-up of her obesity related diagnoses.    Nutrition Plan: the Category 2 plan - 0% adherence.  Current exercise:  Walking on treadmill  Interim History:  She is up 9 lbs since her last visit.  Eating all of the food on the plan., Protein intake is as prescribed, Is not skipping meals, and Water intake is adequate.  Pharmacotherapy:  Hunger is moderately controlled.  Cravings are poorly controlled.  Assessment/Plan:   Eating disorder/emotional eating Dai has had issues with stress eating, emotional eating, and boredom eating. Currently this is poorly controlled. Overall mood is stable. Denies suicidal/homicidal ideation. Medication(s): Wellbutrin 150 mg daily in the am  Plan: Increase Wellbutrin 200 mg daily in the am # 30 with no refills.   Specifically regarding patient's less desirable eating habits and patterns, we employed the technique of small changes when she cannot fully commit to her prudent nutritional plan. Discussed distractions to curb eating behaviors. Be sure to get adequate rest as lack of rest can trigger appetite.  Have plan in place for stressful events.  Consider other rewards besides food.     Visceral Obesity.   She has a visceral fat rating of 21 per the  bio-impedence scale.   Plan: The goal is a visceral fat rating of 13 or below.  Will work on the plan and increase exercise/begin exercise.  Will minimize all carbohydrates ( sweets and starches ).     Morbid Obesity: Current BMI BMI (Calculated): 49.34   Pharmacotherapy Plan Continue and increase dose  Wellbutrin 200 mg 1 daily from 150 mg.   Nalayah is currently in the action stage of change. As such, her goal is to continue with weight loss efforts.  She has agreed to Other Bariatric Pouch Reset .She has not had bariatric surgery, but this is for a reset.   Exercise goals: All adults should avoid inactivity. Some physical activity is better than none, and adults who participate in any amount of physical activity gain some health benefits.  Behavioral modification strategies: increasing lean protein intake, decreasing simple carbohydrates , decrease eating out, meal planning , increase water intake, better snacking choices, planning for success, increasing vegetables, increasing fiber rich foods, ways to avoid night time snacking, avoiding temptations, and mindful eating.  Claudetta has agreed to follow-up with our clinic in 2 weeks.     Objective:   VITALS: Per patient if applicable, see vitals. GENERAL: Alert and in no acute distress. CARDIOPULMONARY: No increased WOB. Speaking in clear sentences.  PSYCH: Pleasant and cooperative. Speech normal rate and rhythm. Affect is appropriate. Insight and judgement are appropriate. Attention is focused, linear, and appropriate.  NEURO: Oriented as arrived to appointment on time with no prompting.   Attestation Statements:    This was prepared  with the assistance of Engineer, civil (consulting).  Occasional wrong-word or sound-a-like substitutions may have occurred due to the inherent limitations of voice recognition software.   Corinna Capra, DO

## 2022-12-15 ENCOUNTER — Ambulatory Visit (INDEPENDENT_AMBULATORY_CARE_PROVIDER_SITE_OTHER): Payer: Medicare Other | Admitting: Bariatrics

## 2022-12-15 ENCOUNTER — Encounter: Payer: Self-pay | Admitting: Bariatrics

## 2022-12-15 VITALS — BP 120/73 | HR 69 | Temp 98.0°F | Ht <= 58 in | Wt 226.0 lb

## 2022-12-15 DIAGNOSIS — F509 Eating disorder, unspecified: Secondary | ICD-10-CM | POA: Diagnosis not present

## 2022-12-15 DIAGNOSIS — E88819 Insulin resistance, unspecified: Secondary | ICD-10-CM

## 2022-12-15 DIAGNOSIS — Z6841 Body Mass Index (BMI) 40.0 and over, adult: Secondary | ICD-10-CM

## 2022-12-15 MED ORDER — BUPROPION HCL ER (SR) 200 MG PO TB12: 200.0000 mg | ORAL_TABLET | Freq: Every day | ORAL | 0 refills | Status: AC

## 2022-12-15 NOTE — Progress Notes (Signed)
WEIGHT SUMMARY AND BIOMETRICS  Weight Lost Since Last Visit: 10lb  Vitals Temp: 98 F (36.7 C) BP: 120/73 Pulse Rate: 69 SpO2: 95 %   Anthropometric Measurements Height: 4\' 10"  (1.473 m) Weight: 226 lb (102.5 kg) BMI (Calculated): 47.25 Weight at Last Visit: 236lb Weight Lost Since Last Visit: 10lb Starting Weight: 226lb Total Weight Loss (lbs): 0 lb (0 kg)   Body Composition  Body Fat %: 52.8 % Fat Mass (lbs): 119.6 lbs Muscle Mass (lbs): 101.6 lbs Total Body Water (lbs): 80.4 lbs Visceral Fat Rating : 20   Other Clinical Data Fasting: no Labs: no Today's Visit #: 6 Starting Date: 08/17/22    OBESITY Remmy is here to discuss her progress with her obesity treatment plan along with follow-up of her obesity related diagnoses.     Nutrition Plan: the Category 2 plan - 0% adherence. Patient has did the "pouch reset" for her meal plan for the last two weeks. She would like to try to low carbohydrate diet.   Current exercise:  Treadmill.  Interim History:  She has been on pouch reset plan and has lost 10 lbs.  Eating all of the food on the plan., Not journaling consistently., and Water intake is adequate.  Pharmacotherapy: Nateisha is on Metformin 500 mg twice daily with meals Adverse side effects: None Hunger is moderately controlled.  Cravings are moderately controlled.  Assessment/Plan:   Insulin Resistance Symantha has had elevated fasting insulin readings. Goal is HgbA1c < 5.7, fasting insulin at l0 or less, and preferably at 5.  She reports polyphagia. Medication(s): Metformin  Lab Results  Component Value Date   INSULIN 13.7 08/17/2022    Plan Medication(s): Metformin 250 mg BID.  Will work on the agreed upon plan ( will begin low carb plan). Discussed the lcarbohydrate diet in some detail and discussed the Dr. Melvyn Neth diet " Isaias Cowman for You".  Will minimize refined carbohydrates ( sweets and starches), and focus more on  complex carbohydrates.  Increase the micronutrients found in leafy greens, which include magnesium, polyphenols, and vitamin C which have been postulated to help with insulin sensitivity. Minimize "fast food" and cook more meals at home.  Increase fiber to 25 to 30 grams daily.   Eating disorder/emotional eating Janet has had issues with stress eating and emotional eating. Currently this is moderately controlled. Overall mood is stable. Denies suicidal/homicidal ideation. Medication(s): Wellbutrin 200 mg daily in the am  Plan:  Specifically regarding patient's less desirable eating habits and patterns, we employed the technique of small changes when she cannot fully commit to her prudent nutritional plan. iscussed distractions to curb eating behaviors. Discussed activities to do with one's hands in the evening  Be sure to get adequate rest as lack of rest can trigger appetite.  Have plan in place for stressful events.  Consider other rewards besides food.     Morbid Obesity: Current BMI BMI (Calculated): 47.25   Pharmacotherapy Plan Continue  Metformin as prescribed.   [place] is currently in the action stage of change. As such, her goal is to continue with weight loss efforts.  She has agreed to the Category 2 plan ( low carbohydrate )  Exercise goals: All adults should avoid inactivity. Some physical activity is better than none, and adults who participate in any amount of physical activity gain some health benefits.  Behavioral modification strategies: increasing lean protein intake, no meal skipping, planning for success, and get rid of junk food in the home.  Melvine  has agreed to follow-up with our clinic in 3 weeks.       Objective:   VITALS: Per patient if applicable, see vitals. GENERAL: Alert and in no acute distress. CARDIOPULMONARY: No increased WOB. Speaking in clear sentences.  PSYCH: Pleasant and cooperative. Speech normal rate and rhythm. Affect is  appropriate. Insight and judgement are appropriate. Attention is focused, linear, and appropriate.  NEURO: Oriented as arrived to appointment on time with no prompting.   Attestation Statements:   This was prepared with the assistance of Engineer, civil (consulting).  Occasional wrong-word or sound-a-like substitutions may have occurred due to the inherent limitations of voice recognition software. Corinna Capra, DO

## 2022-12-21 DIAGNOSIS — K080 Exfoliation of teeth due to systemic causes: Secondary | ICD-10-CM | POA: Diagnosis not present

## 2023-01-03 ENCOUNTER — Ambulatory Visit: Payer: Medicare Other | Admitting: Bariatrics

## 2023-01-12 ENCOUNTER — Ambulatory Visit: Payer: Medicare Other | Admitting: Bariatrics

## 2023-01-13 DIAGNOSIS — H2511 Age-related nuclear cataract, right eye: Secondary | ICD-10-CM | POA: Diagnosis not present

## 2023-01-13 DIAGNOSIS — H2512 Age-related nuclear cataract, left eye: Secondary | ICD-10-CM | POA: Diagnosis not present

## 2023-01-24 ENCOUNTER — Ambulatory Visit: Payer: Medicare Other | Admitting: Bariatrics

## 2023-01-27 DIAGNOSIS — H2512 Age-related nuclear cataract, left eye: Secondary | ICD-10-CM | POA: Diagnosis not present

## 2023-04-04 DIAGNOSIS — K080 Exfoliation of teeth due to systemic causes: Secondary | ICD-10-CM | POA: Diagnosis not present

## 2023-04-19 DIAGNOSIS — E669 Obesity, unspecified: Secondary | ICD-10-CM | POA: Diagnosis not present

## 2023-04-19 DIAGNOSIS — E785 Hyperlipidemia, unspecified: Secondary | ICD-10-CM | POA: Diagnosis not present

## 2023-04-19 DIAGNOSIS — E119 Type 2 diabetes mellitus without complications: Secondary | ICD-10-CM | POA: Diagnosis not present

## 2023-04-19 DIAGNOSIS — I10 Essential (primary) hypertension: Secondary | ICD-10-CM | POA: Diagnosis not present

## 2023-04-19 DIAGNOSIS — F32A Depression, unspecified: Secondary | ICD-10-CM | POA: Diagnosis not present

## 2023-05-16 DIAGNOSIS — K080 Exfoliation of teeth due to systemic causes: Secondary | ICD-10-CM | POA: Diagnosis not present

## 2023-06-08 DIAGNOSIS — K080 Exfoliation of teeth due to systemic causes: Secondary | ICD-10-CM | POA: Diagnosis not present

## 2023-09-20 DIAGNOSIS — B3731 Acute candidiasis of vulva and vagina: Secondary | ICD-10-CM | POA: Diagnosis not present

## 2023-09-20 DIAGNOSIS — N760 Acute vaginitis: Secondary | ICD-10-CM | POA: Diagnosis not present

## 2023-09-25 DIAGNOSIS — Z1329 Encounter for screening for other suspected endocrine disorder: Secondary | ICD-10-CM | POA: Diagnosis not present

## 2023-09-25 DIAGNOSIS — Z13220 Encounter for screening for lipoid disorders: Secondary | ICD-10-CM | POA: Diagnosis not present

## 2023-09-25 DIAGNOSIS — N760 Acute vaginitis: Secondary | ICD-10-CM | POA: Diagnosis not present

## 2023-09-25 DIAGNOSIS — Z0000 Encounter for general adult medical examination without abnormal findings: Secondary | ICD-10-CM | POA: Diagnosis not present

## 2023-10-05 DIAGNOSIS — Z124 Encounter for screening for malignant neoplasm of cervix: Secondary | ICD-10-CM | POA: Diagnosis not present

## 2023-10-05 DIAGNOSIS — N898 Other specified noninflammatory disorders of vagina: Secondary | ICD-10-CM | POA: Diagnosis not present

## 2023-10-05 DIAGNOSIS — Z87440 Personal history of urinary (tract) infections: Secondary | ICD-10-CM | POA: Diagnosis not present
